# Patient Record
Sex: Male | Born: 1945 | Race: White | Hispanic: No | Marital: Married | Smoking: Former smoker
Health system: Southern US, Community
[De-identification: ages and names within clinical notes are randomized; demographics above are authoritative.]

## PROBLEM LIST (undated history)

## (undated) DIAGNOSIS — F1011 Alcohol abuse, in remission: Secondary | ICD-10-CM

## (undated) DIAGNOSIS — M549 Dorsalgia, unspecified: Secondary | ICD-10-CM

## (undated) DIAGNOSIS — M109 Gout, unspecified: Secondary | ICD-10-CM

## (undated) DIAGNOSIS — E119 Type 2 diabetes mellitus without complications: Secondary | ICD-10-CM

## (undated) DIAGNOSIS — G8929 Other chronic pain: Secondary | ICD-10-CM

## (undated) DIAGNOSIS — E785 Hyperlipidemia, unspecified: Secondary | ICD-10-CM

## (undated) DIAGNOSIS — M1990 Unspecified osteoarthritis, unspecified site: Secondary | ICD-10-CM

## (undated) DIAGNOSIS — I10 Essential (primary) hypertension: Secondary | ICD-10-CM

## (undated) DIAGNOSIS — E781 Pure hyperglyceridemia: Secondary | ICD-10-CM

## (undated) HISTORY — DX: Unspecified osteoarthritis, unspecified site: M19.90

## (undated) HISTORY — DX: Pure hyperglyceridemia: E78.1

---

## 2002-06-30 ENCOUNTER — Ambulatory Visit (HOSPITAL_COMMUNITY): Admission: RE | Admit: 2002-06-30 | Discharge: 2002-06-30 | Payer: Self-pay | Admitting: Gastroenterology

## 2005-05-28 ENCOUNTER — Emergency Department (HOSPITAL_COMMUNITY): Admission: EM | Admit: 2005-05-28 | Discharge: 2005-05-28 | Payer: Self-pay | Admitting: Emergency Medicine

## 2008-01-05 ENCOUNTER — Emergency Department (HOSPITAL_COMMUNITY): Admission: EM | Admit: 2008-01-05 | Discharge: 2008-01-05 | Payer: Self-pay | Admitting: Emergency Medicine

## 2010-06-20 ENCOUNTER — Ambulatory Visit: Payer: Self-pay | Admitting: Gastroenterology

## 2010-06-22 LAB — PATHOLOGY REPORT

## 2014-12-01 LAB — HM DIABETES EYE EXAM

## 2014-12-16 ENCOUNTER — Observation Stay: Payer: Self-pay | Admitting: Internal Medicine

## 2015-01-06 ENCOUNTER — Other Ambulatory Visit: Payer: Self-pay | Admitting: Orthopaedic Surgery

## 2015-01-06 DIAGNOSIS — M545 Low back pain: Secondary | ICD-10-CM

## 2015-01-28 ENCOUNTER — Other Ambulatory Visit: Payer: Self-pay | Admitting: Orthopaedic Surgery

## 2015-01-28 ENCOUNTER — Ambulatory Visit
Admission: RE | Admit: 2015-01-28 | Discharge: 2015-01-28 | Disposition: A | Discharge status: Home / Self Care | Payer: Medicare HMO | Source: Ambulatory Visit | Attending: Orthopaedic Surgery | Admitting: Orthopaedic Surgery

## 2015-01-28 DIAGNOSIS — M545 Low back pain: Secondary | ICD-10-CM

## 2015-01-28 DIAGNOSIS — Z77018 Contact with and (suspected) exposure to other hazardous metals: Secondary | ICD-10-CM

## 2015-02-21 NOTE — H&P (Signed)
PATIENT NAME:  [name], [name] DATE OF BIRTH:  1945/12/16  DATE OF ADMISSION:  12/16/2014  REFERRING PHYSICIAN: Enedina Finnerandolph N. Manson PasseyBrown, MD  PRIMARY CARE PHYSICIAN: Marcine Matarharles W. Phillips Jr., MD  ADMISSION DIAGNOSIS: Hypoglycemia.   HISTORY OF PRESENT ILLNESS: This is a 69 year old Caucasian male who presents to the Emergency Department complaining of feeling bad. The patient states that he began to feel weak and generally unwell this afternoon. His sister called him and asked if there was anything that she could do. He requested a hamburger and ate that without difficulty. He denies feeling any nausea or vomiting, but still felt bad after eating, which prompted him to come to the Emergency Department where he was found to have a low blood sugar. The patient had not been checking his blood sugar as he has been directed at home. He knows he has diabetes and takes his glimepiride after breakfast daily. He has not had any issues with a low blood sugar that he knows about; however, he does mention that he has not felt 100% for a few days now. Notably, he also states that his back pain was worse today than it has ever been "in his entire life." The patient also notes that he has had a dry skin rash on his face for a few weeks, but denies any other symptoms. In the Emergency Department, the patient had 1 ampule of D50 as well as 16 ounces of orange juice as well as peanut butter and crackers. His blood sugar improved from a 39 to 222 after the above interventions; however, it dropped to 119 in one hour, which prompted the Emergency Department to call for admission.   REVIEW OF SYSTEMS:  CONSTITUTIONAL: The patient denies fever, but admits to weakness.  EYES: Denies blurred vision or inflammation.  EARS, NOSE AND THROAT: Denies tinnitus or sore throat.  RESPIRATORY: Denies cough or shortness of breath.  CARDIOVASCULAR: Denies chest pain, palpitations, orthopnea, paroxysmal nocturnal dyspnea.   GASTROINTESTINAL: Denies nausea, vomiting, diarrhea, or abdominal pain.  GENITOURINARY: Denies dysuria, increased frequency or hesitancy of urination.  ENDOCRINE: Denies polyuria or polydipsia.  HEMATOLOGIC AND LYMPHATIC: Denies easy bruising or bleeding.  INTEGUMENTARY: Admits to rash, but denies lesions.  MUSCULOSKELETAL: Admits to back pain, but denies myalgias.  NEUROLOGIC: Denies numbness in his extremities or dysarthria.  PSYCHIATRIC: Denies suicidal ideation or depression.   PAST MEDICAL HISTORY: Diabetes type 2, hypertension, hyperlipidemia, and chronic back pain.   PAST SURGICAL HISTORY: The patient has had no surgeries.   FAMILY HISTORY: His mother had diabetes type 2.   SOCIAL HISTORY: The patient continues to chew tobacco. He says he drinks about 2 beers per day. He denies any drug use. He currently lives by himself, but his wife does not currently lives in his household, as she is recuperating from surgery. 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   MEDICATIONS:  1.  Allopurinol 300 mg 1 tablet p.o. daily.  2.  Amlodipine 10 mg 1 tablet p.o. daily.  3.  Celecoxib 200 mg 1 capsule p.o. daily.  4.  Fenofibrate 67 mg 1 capsule p.o. daily.  5.  Glimepiride 2 mg 1 tablet p.o. daily.  6.  Quinapril 40 mg 1 tablet p.o. daily.  7.  Tramadol 50 mg 1 tablet p.o. every 4-6 hours as needed for pain.   ALLERGIES: ALEVE.   PERTINENT LABORATORY RESULTS AND RADIOGRAPHIC FINDINGS: Serum glucose initially 39, BUN 11, creatinine 0.76, serum sodium 131, potassium  is 3.5, chloride 99, bicarbonate 20, calcium is 9, serum albumin is 3.7, alkaline phosphatase 90, AST 38, ALT 13. White blood cell count is 9, platelet count is 255,000, hemoglobin is 14.7, hematocrit is 44.5, MCV is 100. Urinalysis is negative for infection. Chest x-ray shows no active cardiopulmonary disease.   PHYSICAL EXAMINATION:  VITAL SIGNS: Temperature is 98.2, pulse 85, respirations 20, blood pressure 136/71,  pulse oximetry is 97% on room air.  GENERAL: The patient is alert and oriented x 3 in no apparent distress.  HEENT: Normocephalic, atraumatic. Pupils equal, round, and reactive to light and accommodation. Extraocular movements are intact. Mucous membranes are moist.  NECK: Trachea is midline. No adenopathy. Thyroid is nonpalpable and nontender.  CHEST: Symmetric and atraumatic.  CARDIOVASCULAR: Regular rate and rhythm. Normal S1, S2. No rubs, clicks, or murmurs appreciated.  LUNGS: Clear to auscultation bilaterally. Normal effort and excursion.  ABDOMEN: Positive bowel sounds. Soft, nontender, nondistended. No hepatosplenomegaly.  GENITOURINARY: Deferred.  MUSCULOSKELETAL: The patient moves all 4 extremities equally. He has full range of motion in all 4 extremities. I have not tested his gait.  SKIN: No lesions, but the patient does have a dry erythematous flaking rash on his face as well as some areas of lichenification on his fingers.  EXTREMITIES: No clubbing, cyanosis, or edema.  NEUROLOGIC: Cranial nerves II-XII are grossly intact.  PSYCHIATRIC: Mood is normal. Affect is congruent. The patient has good judgment and insight into his medical condition.   ASSESSMENT AND PLAN: This is a 69 year old male admitted for hypoglycemia.  1.  Hypoglycemia is currently improving: We will continue to monitor the patient. I have given him intravenous fluid with dextrose and we will allow him to eat a regular diet despite having diabetes. We will hold all oral hypoglycemics and not initiate any sliding scale insulin until his sugar has normalized.  2.  Diabetes type 2: Holding medications for now. I will check an A1c, which the patient states has been under control essentially ever since he was diagnosed with diabetes.  3.  Hypertension: We will continue amlodipine and quinapril.  4.  Chronic back pain: Continue Celebrex. I have discontinued tramadol, as this interacts with so many medicines.  5.  Facial  rash: It appears to be a hypersensitivity type rash. I have held tramadol as well as allopurinol due to their interaction with other medications as well as their induction of CYP enzymes in the liver.  6.  Deep vein thrombosis prophylaxis: Heparin.  7.  Gastrointestinal prophylaxis: None.   CODE STATUS: The patient is a full code.   TIME SPENT ON ADMISSION ORDERS AND PATIENT CARE: Approximately 40 minutes.    ____________________________ Kelton Pillar. Sheryle Hail, MD msd:bm D: 12/16/2014 04:03:07 ET T: 12/16/2014 04:52:32 ET JOB#: [id number]  cc: Kelton Pillar. Sheryle Hail, MD, <Dictator> Kelton Pillar DIAMOND MD ELECTRONICALLY SIGNED 12/17/2014 0:45

## 2015-02-21 NOTE — Discharge Summary (Signed)
PATIENT NAME:  [name], Joshua E MR#:  [id number] DATE OF BIRTH:  [birth year]  DATE OF ADMISSION:  12/16/2014 DATE OF DISCHARGE:  12/16/2014  DISCHARGE DIAGNOSES:  1.  Hypoglycemia secondary to diabetic medication. 2.  Type 2 diabetes mellitus. 3.  Hypertension.   DISCHARGE MEDICATIONS:  1.  Fenofibrate 67 mg p.o. daily. 2.  Allopurinol 300 mg p.o. daily. 3.  Tramadol 50 mg every 4-6 hours as needed for pain. 4.  Celebrex 200 mg p.o. daily. 5.  Quinapril 14 mg p.o. daily. 6.  Amlodipine 10 mg p.o. daily. 7.  Amaryl 1 mg p.o. daily. 8.  Lortab has been decreased from 4 mg to 1 mg daily.  DIET:  Carbohydrate controlled diet. He was advised to take Amaryl tomorrow, 12/17/2014.    CONSULTATIONS: None.   HOSPITAL COURSE: The patient is a 69 year old male patient with type 2 diabetes mellitus.  He takes Amaryl 10 mg. He comes in because of not feeling well and also hypoglycemia. The patient had no recent illnesses. No recent fever or sickness. No change in diabetic medication recently. He did not have any diarrhea. He did not have any loss of weight recently. The patient was feeling weak and he called his sister and he said he wanted to eat a burger and he ate a burger but he continued to feel weak and so he still called EMS. By the time EMS arrived, sugars were 39 and so the patient received  also D50 and sugars went up to 222 and then dropped again to 119, so because of the development of hypoglycemia he was admitted to observation status. The patient lives alone. He has some back pain issues for a long time but he says he did not have any recent illnesses or start new medications. He also says that he ate breakfast yesterday and took the medications.  He is admitted for hypoglycemia, started on D5   NS  at  150 mL/h. The patient initially received D5 normal saline but changed to D10 at 100 because of persistent hypoglycemia. We were checking insulin initially every 1 hour and after that every 2  hours. The patient's hyperglycemia resolved.  Sugars have been normal, more than 100 persistently 2 times. The patient had a good lunch and he did not have any further hypoglycemia. He said he wants to go home and he feels well and he denies any complaints. The patient's chest x-ray did not show any pneumonia and urine is clear without evidence of infection. CBC and albumin are within normal limits. The patient's hypoglycemia resolved and he went home in stable condition. I advised him to decrease the dose of Amaryl to 1 mg and see his doctor, Dr. Loma Senderharles Phillips, in about a week. He also has a glucometer at home. I advised him to check blood sugars in the morning and evening as well.    High blood pressure is fairly stable and at the time of discharge was 147/87, heart rate 87.  Regarding blood pressure he is on amlodipine and also quinapril.  History of gout. He is on allopurinol and Celebrex. We will continue that.   PHYSICAL EXAMINATION AT THE TIME OF DISCHARGE:   HEART: S1 and S2 regular.  LUNGS: Clear to auscultation. No wheeze. No rales.  ABDOMEN: Soft, nontender, and nondistended. Bowel sounds present. No focal neurological deficits.   CONDITION ON DISCHARGE:  The patient went home in stable condition.   TIME SPENT:  30 minutes.   ____________________________ Joshua HammingSnehalatha Merryl Buckels, MD  sk:mc D: 12/16/2014 17:57:53 ET T: 12/17/2014 09:23:11 ET JOB#: [id number]  cc: Joshua Hamming, MD, <Dictator> Joshua Hamming MD ELECTRONICALLY SIGNED 12/28/2014 13:54

## 2016-01-17 ENCOUNTER — Emergency Department
Admission: EM | Admit: 2016-01-17 | Discharge: 2016-01-17 | Disposition: A | Discharge status: Home / Self Care | Payer: Medicare HMO | Attending: Emergency Medicine | Admitting: Emergency Medicine

## 2016-01-17 ENCOUNTER — Encounter: Payer: Self-pay | Admitting: Medical Oncology

## 2016-01-17 DIAGNOSIS — Z888 Allergy status to other drugs, medicaments and biological substances status: Secondary | ICD-10-CM | POA: Diagnosis not present

## 2016-01-17 DIAGNOSIS — I10 Essential (primary) hypertension: Secondary | ICD-10-CM | POA: Diagnosis not present

## 2016-01-17 DIAGNOSIS — E119 Type 2 diabetes mellitus without complications: Secondary | ICD-10-CM | POA: Diagnosis not present

## 2016-01-17 DIAGNOSIS — R008 Other abnormalities of heart beat: Secondary | ICD-10-CM | POA: Diagnosis not present

## 2016-01-17 DIAGNOSIS — Z87891 Personal history of nicotine dependence: Secondary | ICD-10-CM | POA: Diagnosis not present

## 2016-01-17 DIAGNOSIS — I498 Other specified cardiac arrhythmias: Secondary | ICD-10-CM

## 2016-01-17 DIAGNOSIS — I499 Cardiac arrhythmia, unspecified: Secondary | ICD-10-CM

## 2016-01-17 DIAGNOSIS — R000 Tachycardia, unspecified: Secondary | ICD-10-CM | POA: Diagnosis present

## 2016-01-17 HISTORY — DX: Essential (primary) hypertension: I10

## 2016-01-17 HISTORY — DX: Type 2 diabetes mellitus without complications: E11.9

## 2016-01-17 LAB — BASIC METABOLIC PANEL
ANION GAP: 12 (ref 5–15)
BUN: 10 mg/dL (ref 6–20)
CHLORIDE: 104 mmol/L (ref 101–111)
CO2: 21 mmol/L — ABNORMAL LOW (ref 22–32)
Calcium: 8.9 mg/dL (ref 8.9–10.3)
Creatinine, Ser: 0.63 mg/dL (ref 0.61–1.24)
GFR calc Af Amer: >60 mL/min (ref >60)
Glucose, Bld: 78 mg/dL (ref 65–99)
POTASSIUM: 4 mmol/L (ref 3.5–5.1)
SODIUM: 137 mmol/L (ref 135–145)

## 2016-01-17 LAB — CBC
HEMATOCRIT: 47.5 % (ref 40.0–52.0)
HEMOGLOBIN: 16 g/dL (ref 13.0–18.0)
MCH: 32.3 pg (ref 26.0–34.0)
MCHC: 33.6 g/dL (ref 32.0–36.0)
MCV: 95.9 fL (ref 80.0–100.0)
Platelets: 195 10*3/uL (ref 150–440)
RBC: 4.95 MIL/uL (ref 4.40–5.90)
RDW: 14 % (ref 11.5–14.5)
WBC: 6.8 10*3/uL (ref 3.8–10.6)

## 2016-01-17 LAB — TROPONIN I: Troponin I: <0.03 ng/mL (ref <0.031)

## 2016-01-17 MED ORDER — METOPROLOL SUCCINATE ER 25 MG PO TB24: 25.0000 mg | ORAL_TABLET | Freq: Every day | ORAL | Status: AC

## 2016-01-17 MED ORDER — AMLODIPINE BESYLATE 10 MG PO TABS: 10.0000 mg | ORAL_TABLET | Freq: Every day | ORAL | Status: DC

## 2016-01-17 MED ORDER — METOPROLOL SUCCINATE ER 50 MG PO TB24
25.0000 mg | ORAL_TABLET | Freq: Every day | ORAL | Status: DC
  Administered 2016-01-17: 25 mg via ORAL
  Filled 2016-01-17: qty 2

## 2016-01-17 MED ORDER — AMLODIPINE BESYLATE 5 MG PO TABS
5.0000 mg | ORAL_TABLET | Freq: Once | ORAL | Status: AC
  Administered 2016-01-17: 5 mg via ORAL
  Filled 2016-01-17: qty 1

## 2016-01-17 NOTE — ED Notes (Signed)
Pt to ED with high HR in low 100's -110's that was noted at Urgent Care.  Pt was at Urgent Care to get refill in blood pressure medication, amlodipine.  On cardiac monitor, patient is noted to have Sinus Tachycardia with frequent monomorphic PVC's and several 3 beat runs of Vtach noted.  Pt asymptomatic with runs.

## 2016-01-17 NOTE — ED Provider Notes (Signed)
[facility] Emergency Department Provider Note     Time seen: ----------------------------------------- 11:28 AM on 01/17/2016 -----------------------------------------    I have reviewed the triage vital signs and the nursing notes.   HISTORY  Chief Complaint Tachycardia    HPI [name] is a 70 y.o. male [facility]. They checked his heart rate and noted to be 113 he was told to come to ER for evaluation. Patient reports his only been out of his blood pressure medicines for 1 day. He denies fevers, chills, chest pain, shortness of breath, other complaints. Patient states chronic back problems which prohibited him from exercising. Patient has not noted any arrhythmias.   Past Medical History  Diagnosis Date  . Diabetes mellitus without complication (HCC)   . Hypertension     There are no active problems to display for this patient.   History reviewed. No pertinent past surgical history.  Allergies Aleve  Social History Social History  Substance Use Topics  . Smoking status: Former Games developer  . Smokeless tobacco: None  . Alcohol Use: None    Review of Systems Constitutional: Negative for fever. Eyes: Negative for visual changes. ENT: Negative for sore throat. Cardiovascular: Negative for chest pain. Respiratory: Negative for shortness of breath. Gastrointestinal: Negative for abdominal pain, vomiting and diarrhea. Genitourinary: Negative for dysuria. Musculoskeletal: Negative for back pain. Skin: Negative for rash. Neurological: Negative for headaches, focal weakness or numbness.  10-point ROS otherwise negative.  ____________________________________________   PHYSICAL EXAM:  VITAL SIGNS: ED Triage Vitals  Enc Vitals Group     BP 01/17/16 0953 142/79 mmHg     Pulse Rate 01/17/16 0953 92     Resp 01/17/16 0953 18     Temp 01/17/16 0953  97.5 F (36.4 C)     Temp Source 01/17/16 0953 Oral     SpO2 01/17/16 0953 99 %     Weight 01/17/16 0953 150 lb (68.04 kg)     Height 01/17/16 0953  (1.702 m)     Head Cir --      Peak Flow --      Pain Score 01/17/16 0954 3     Pain Loc --      Pain Edu? --      Excl. in GC? --     Constitutional: Alert and oriented. Disheveled, in no distress Eyes: Conjunctivae are normal. PERRL. Normal extraocular movements. ENT   Head: Normocephalic and atraumatic.   Nose: No congestion/rhinnorhea.   Mouth/Throat: Mucous membranes are moist.   Neck: No stridor. Cardiovascular: Normal rate, irregular rhythm. Normal and symmetric distal pulses are present in all extremities. No murmurs, rubs, or gallops. Respiratory: Normal respiratory effort without tachypnea nor retractions. Breath sounds are clear and equal bilaterally. No wheezes/rales/rhonchi. Gastrointestinal: Soft and nontender. No distention. No abdominal bruits.  Musculoskeletal: Nontender with normal range of motion in all extremities. No joint effusions.  No lower extremity tenderness nor edema. Neurologic:  Normal speech and language. No gross focal neurologic deficits are appreciated.  Skin:  Skin is warm, dry and intact. No rash noted. Psychiatric: Mood and affect are normal. Speech and behavior are normal. Patient exhibits appropriate insight and judgment. ____________________________________________  EKG: Interpreted by me. Normal sinus rhythm with frequent PVCs in a pattern of bigeminy. Normal PR interval, normal QRS, normal QT interval. Normal axis.  ____________________________________________  ED COURSE:  Pertinent labs & imaging results that were  available during my care of the patient were reviewed by me and considered in my medical decision making (see chart for details).  patient presents and bigeminy with couplets of PVCs. I will discuss with cardiology and check basic labs.   ____________________________________________    LABS (pertinent positives/negatives)  Labs Reviewed  BASIC METABOLIC PANEL - Abnormal; Notable for the following:    CO2 21 (*)    All other components within normal limits  CBC  TROPONIN I   Assessment and plan:  Bigeminy  Plan: Patient presents with labs as dictated above in bigeminy. I discussed case with Dr. Juliann Paresallwood who recommends starting metoprolol in addition to restarting his amlodipine. At this point is asymptomatic, we'll advise close follow-up with Dr. Juliann Paresallwood for outpatient recheck.   Emily FilbertWilliams, Jonathan E, MD   Emily FilbertJonathan E Williams, MD 01/17/16 [phone]

## 2016-01-17 NOTE — ED Notes (Signed)
[facility] reports [facility] med [facility] and they checked his HR and noted it to be 113 and was told to come to er. Pt reports he has only been out of 1 day of his BP med. Pt denies chest pain, denies other sx's but a slight headache.

## 2016-01-17 NOTE — Discharge Instructions (Signed)
Hypertension °Hypertension, commonly called high blood pressure, is when the force of blood pumping through your arteries is too strong. Your arteries are the blood vessels that carry blood from your heart throughout your body. A blood pressure reading consists of a higher number over a lower number, such as 110/72. The higher number (systolic) is the pressure inside your arteries when your heart pumps. The lower number (diastolic) is the pressure inside your arteries when your heart relaxes. Ideally you want your blood pressure below 120/80. °Hypertension forces your heart to work harder to pump blood. Your arteries may become narrow or stiff. Having untreated or uncontrolled hypertension can cause heart attack, stroke, kidney disease, and other problems. °RISK FACTORS °Some risk factors for high blood pressure are controllable. Others are not.  °Risk factors you cannot control include:  °· Race. You may be at higher risk if you are African American. °· Age. Risk increases with age. °· Gender. Men are at higher risk than women before age 45 years. After age 65, women are at higher risk than men. °Risk factors you can control include: °· Not getting enough exercise or physical activity. °· Being overweight. °· Getting too much fat, sugar, calories, or salt in your diet. °· Drinking too much alcohol. °SIGNS AND SYMPTOMS °Hypertension does not usually cause signs or symptoms. Extremely high blood pressure (hypertensive crisis) may cause headache, anxiety, shortness of breath, and nosebleed. °DIAGNOSIS °To check if you have hypertension, your health care provider will measure your blood pressure while you are seated, with your arm held at the level of your heart. It should be measured at least twice using the same arm. Certain conditions can cause a difference in blood pressure between your right and left arms. A blood pressure reading that is higher than normal on one occasion does not mean that you need treatment. If  it is not clear whether you have high blood pressure, you may be asked to return on a different day to have your blood pressure checked again. Or, you may be asked to monitor your blood pressure at home for 1 or more weeks. °TREATMENT °Treating high blood pressure includes making lifestyle changes and possibly taking medicine. Living a healthy lifestyle can help lower high blood pressure. You may need to change some of your habits. °Lifestyle changes may include: °· Following the DASH diet. This diet is high in fruits, vegetables, and whole grains. It is low in salt, red meat, and added sugars. °· Keep your sodium intake below 2,300 mg per day. °· Getting at least 30-45 minutes of aerobic exercise at least 4 times per week. °· Losing weight if necessary. °· Not smoking. °· Limiting alcoholic beverages. °· Learning ways to reduce stress. °Your health care provider may prescribe medicine if lifestyle changes are not enough to get your blood pressure under control, and if one of the following is true: °· You are 18-59 years of age and your systolic blood pressure is above 140. °· You are 60 years of age or older, and your systolic blood pressure is above 150. °· Your diastolic blood pressure is above 90. °· You have diabetes, and your systolic blood pressure is over 140 or your diastolic blood pressure is over 90. °· You have kidney disease and your blood pressure is above 140/90. °· You have heart disease and your blood pressure is above 140/90. °Your personal target blood pressure may vary depending on your medical conditions, your age, and other factors. °HOME CARE INSTRUCTIONS °·   Have your blood pressure rechecked as directed by your health care provider.   °· Take medicines only as directed by your health care provider. Follow the directions carefully. Blood pressure medicines must be taken as prescribed. The medicine does not work as well when you skip doses. Skipping doses also puts you at risk for  problems. °· Do not smoke.   °· Monitor your blood pressure at home as directed by your health care provider.  °SEEK MEDICAL CARE IF:  °· You think you are having a reaction to medicines taken. °· You have recurrent headaches or feel dizzy. °· You have swelling in your ankles. °· You have trouble with your vision. °SEEK IMMEDIATE MEDICAL CARE IF: °· You develop a severe headache or confusion. °· You have unusual weakness, numbness, or feel faint. °· You have severe chest or abdominal pain. °· You vomit repeatedly. °· You have trouble breathing. °MAKE SURE YOU:  °· Understand these instructions. °· Will watch your condition. °· Will get help right away if you are not doing well or get worse. °  °This information is not intended to replace advice given to you by your health care provider. Make sure you discuss any questions you have with your health care provider. °  °Document Released: 10/09/2005 Document Revised: 02/23/2015 Document Reviewed: 08/01/2013 °Elsevier Interactive Patient Education ©2016 Else[employer] Inc[employer] ° °Palpitations °A palpitation is the feeling that your heartbeat is irregular or is faster than normal. It may feel like your heart is fluttering or skipping a beat. Palpitations are usually not a serious problem. However, in some cases, you may need further medical evaluation. °CAUSES  °Palpitations can be caused by: °· Smoking. °· Caffeine or other stimulants, such as diet pills or energy drinks. °· Alcohol. °· Stress and anxiety. °· Strenuous physical activity. °· Fatigue. °· Certain medicines. °· Heart disease, especially if you have a history of irregular heart rhythms (arrhythmias), such as atrial fibrillation, atrial flutter, or supraventricular tachycardia. °· An improperly working pacemaker or defibrillator. °DIAGNOSIS  °To find the cause of your palpitations, your health care provider will take your medical history and perform a physical exam. Your health care provider may also have you take a  test called an ambulatory electrocardiogram (ECG). An ECG records your heartbeat patterns over a 24-hour period. You may also have other tests, such as: °· Transthoracic echocardiogram (TTE). During echocardiography, sound waves are used to evaluate how blood flows through your heart. °· Transesophageal echocardiogram (TEE). °· Cardiac monitoring. This allows your health care provider to monitor your heart rate and rhythm in real time. °· Holter monitor. This is a portable device that records your heartbeat and can help diagnose heart arrhythmias. It allows your health care provider to track your heart activity for several days, if needed. °· Stress tests by exercise or by giving medicine that makes the heart beat faster. °TREATMENT  °Treatment of palpitations depends on the cause of your symptoms and can vary greatly. Most cases of palpitations do not require any treatment other than time, relaxation, and monitoring your symptoms. Other causes, such as atrial fibrillation, atrial flutter, or supraventricular tachycardia, usually require further treatment. °HOME CARE INSTRUCTIONS  °· Avoid: °¨ Caffeinated coffee, tea, soft drinks, diet pills, and energy drinks. °¨ Chocolate. °¨ Alcohol. °· Stop smoking if you smoke. °· Reduce your stress and anxiety. Things that can help you relax include: °¨ A method of controlling things in your body, such as your heartbeats, with your mind (biofeedback). °¨ Yoga. °¨ Meditation. °¨   Physical activity such as swimming, jogging, or walking. °· Get plenty of rest and sleep. °SEEK MEDICAL CARE IF:  °· You continue to have a fast or irregular heartbeat beyond 24 hours. °· Your palpitations occur more often. °SEEK IMMEDIATE MEDICAL CARE IF: °· You have chest pain or shortness of breath. °· You have a severe headache. °· You feel dizzy or you faint. °MAKE SURE YOU: °· Understand these instructions. °· Will watch your condition. °· Will get help right away if you are not doing well or get  worse. °  °This information is not intended to replace advice given to you by your health care provider. Make sure you discuss any questions you have with your health care provider. °  °Document Released: 10/06/2000 Document Revised: 10/14/2013 Document Reviewed: 12/08/2011 °Elsevier Interactive Patient Education ©2016 Elsevier Inc. ° °

## 2016-01-24 ENCOUNTER — Encounter: Payer: Self-pay | Admitting: Internal Medicine

## 2016-01-25 ENCOUNTER — Encounter: Payer: Self-pay | Admitting: Internal Medicine

## 2016-01-25 ENCOUNTER — Ambulatory Visit (INDEPENDENT_AMBULATORY_CARE_PROVIDER_SITE_OTHER): Payer: Medicare HMO | Admitting: Internal Medicine

## 2016-01-25 VITALS — BP 126/72 | HR 73 | Temp 98.2°F | Ht 67.0 in | Wt 148.5 lb

## 2016-01-25 DIAGNOSIS — M1A0790 Idiopathic chronic gout, unspecified ankle and foot, without tophus (tophi): Secondary | ICD-10-CM | POA: Diagnosis not present

## 2016-01-25 DIAGNOSIS — E785 Hyperlipidemia, unspecified: Secondary | ICD-10-CM | POA: Diagnosis not present

## 2016-01-25 DIAGNOSIS — G8929 Other chronic pain: Secondary | ICD-10-CM

## 2016-01-25 DIAGNOSIS — M109 Gout, unspecified: Secondary | ICD-10-CM | POA: Insufficient documentation

## 2016-01-25 DIAGNOSIS — I10 Essential (primary) hypertension: Secondary | ICD-10-CM | POA: Diagnosis not present

## 2016-01-25 DIAGNOSIS — M549 Dorsalgia, unspecified: Secondary | ICD-10-CM

## 2016-01-25 DIAGNOSIS — E119 Type 2 diabetes mellitus without complications: Secondary | ICD-10-CM | POA: Diagnosis not present

## 2016-01-25 LAB — COMPREHENSIVE METABOLIC PANEL
ALBUMIN: 4.2 g/dL (ref 3.5–5.2)
ALT: 13 U/L (ref 0–53)
AST: 32 U/L (ref 0–37)
Alkaline Phosphatase: 61 U/L (ref 39–117)
BUN: 12 mg/dL (ref 6–23)
CHLORIDE: 99 meq/L (ref 96–112)
CO2: 27 mEq/L (ref 19–32)
Calcium: 9.7 mg/dL (ref 8.4–10.5)
Creatinine, Ser: 0.61 mg/dL (ref 0.40–1.50)
GFR: 139.21 mL/min (ref >60.00)
Glucose, Bld: 71 mg/dL (ref 70–99)
POTASSIUM: 4 meq/L (ref 3.5–5.1)
SODIUM: 136 meq/L (ref 135–145)
Total Bilirubin: 0.7 mg/dL (ref 0.2–1.2)
Total Protein: 7.6 g/dL (ref 6.0–8.3)

## 2016-01-25 LAB — LIPID PANEL
CHOLESTEROL: 155 mg/dL (ref 0–200)
HDL: 99.3 mg/dL (ref >39.00)
LDL CALC: 44 mg/dL (ref 0–99)
NonHDL: 55.52
Total CHOL/HDL Ratio: 2
Triglycerides: 56 mg/dL (ref 0.0–149.0)
VLDL: 11.2 mg/dL (ref 0.0–40.0)

## 2016-01-25 LAB — HEMOGLOBIN A1C: HEMOGLOBIN A1C: 5 % (ref 4.6–6.5)

## 2016-01-25 NOTE — Assessment & Plan Note (Signed)
Controlled on Toprol and Accupril CBC and CMET today

## 2016-01-25 NOTE — Patient Instructions (Signed)

## 2016-01-25 NOTE — Progress Notes (Signed)
Pre visit review using our clinic review tool, if applicable. No additional management support is needed unless otherwise documented below in the visit note. 

## 2016-01-25 NOTE — Assessment & Plan Note (Signed)
Encouraged him to consume a low fat diet Continue Fenofibrate

## 2016-01-25 NOTE — Assessment & Plan Note (Signed)
Will check A1C today Encouraged him to consume a low fat, low carb diet Foot exam today Encouraged yearly eye exams Flu shot UTD He declines pneumonia vaccines Continue Amaryl unless instructed otherwise

## 2016-01-25 NOTE — Assessment & Plan Note (Signed)
Advised him to continue Celebrex He has already been referred to pain management by cardiology

## 2016-01-25 NOTE — Assessment & Plan Note (Signed)
Continue Allopurinol Will check uric acid level today

## 2016-01-25 NOTE — Progress Notes (Signed)
HPI  Pt presents to the clinic today to establish care and for management of the conditions listed below. He is transferring care from Dr. Vear Clock.  DM 2: He checks his sugars daily but can not recall what they are running. He takes Amaryl daily as prescribed. He has had 1 hypoglycemic episode of 34, for which he had to go to the ER. His last eye exam was 1 year ago. His flu shot was 07/2015. He does not take pneumonia vaccines.  HTN: BP controlled on Accupril and Toprol. His BP today is 126/72. He reports he was not feeling well the other day, checked his BP at home with a wrist cuff and it was 195/155. He was dizzy but denied blurred vision, chest pain or shortness of breath. He was having sensation that his heart was racing prior to being put on Metoprolol. He recently established care with Dr. Fransisco Beau at [facility].  HLD: He has not had his cholesterol checked in the last 6 months. He is taking Fenofibrate as prescribed. He does not try to consume a low fat diet.  Gout: He has not had any flares since he started on Allopurinol.  Chronic Back Pain: He takes Celebrex daily, but does not feel like it helps. He is awaiting pain clinic referral from his cardiologist.  Past Medical History  Diagnosis Date  . Diabetes mellitus without complication (HCC)   . Hypertension   . Hypertriglyceridemia   . Arthritis     Current Outpatient Prescriptions  Medication Sig Dispense Refill  . allopurinol (ZYLOPRIM) 300 MG tablet Take 300 mg by mouth daily.    . celecoxib (CELEBREX) 200 MG capsule Take 200 mg by mouth daily.    . fenofibrate micronized (LOFIBRA) 67 MG capsule TAKE ONE CAPSULE BY MOUTH EVERY DAY WITH SUPPER  1  . glimepiride (AMARYL) 1 MG tablet Take 1 mg by mouth daily with breakfast.     . metoprolol succinate (TOPROL XL) 25 MG 24 hr tablet Take 1 tablet (25 mg total) by mouth daily. 30 tablet 1  . quinapril (ACCUPRIL) 20 MG tablet Take 20 mg by mouth daily.      No current  facility-administered medications for this visit.    Allergies  Allergen Reactions  . Aleve [Naproxen]     Family History  Problem Relation Age of Onset  . Colon cancer Mother   . Hyperlipidemia Mother   . Hypertension Mother   . Arthritis Mother   . Arthritis Father   . Heart disease Father   . Heart disease Paternal Grandfather     Social History   Social History  . Marital Status: Married    Spouse Name: N/A  . Number of Children: N/A  . Years of Education: N/A   Occupational History  . Not on file.   Social History Main Topics  . Smoking status: Former Games developer  . Smokeless tobacco: Not on file  . Alcohol Use: 0.0 oz/week    0 Standard drinks or equivalent per week     Comment: beer--2 daily  . Drug Use: No  . Sexual Activity: Yes   Other Topics Concern  . Not on file   Social History Narrative    ROS:  Constitutional: Denies fever, malaise, fatigue, headache or abrupt weight changes.  HEENT: Denies eye pain, eye redness, ear pain, ringing in the ears, wax buildup, runny nose, nasal congestion, bloody nose, or sore throat. Respiratory: Denies difficulty breathing, shortness of breath, cough or sputum production.  Cardiovascular: Denies chest pain, chest tightness, palpitations or swelling in the hands or feet.  Musculoskeletal: Pt reports chronic back pain. Denies difficulty with gait, muscle pain or joint swelling.  Skin: Denies redness, rashes, lesions or ulcercations.  Neurological: Denies dizziness, difficulty with memory, difficulty with speech or problems with balance and coordination.  Psych: Pt's sister reports patient is feeling anxious (he denies this). Denies anxiety, depression, SI/HI.  No other specific complaints in a complete review of systems (except as listed in HPI above).  PE:  BP 126/72 mmHg  Pulse 73  Temp(Src) 98.2 F (36.8 C) (Oral)  Ht 5\' 7"  (1.702 m)  Wt 148 lb 8 oz (67.359 kg)  BMI 23.25 kg/m2  SpO2 72% Wt Readings from  Last 3 Encounters:  01/25/16 148 lb 8 oz (67.359 kg)  01/17/16 150 lb (68.04 kg)    General: Appears his stated age, chronically ill appearing, in NAD. Cardiovascular: Normal rate and rhythm. S1,S2 noted.  No murmur, rubs or gallops noted. No JVD or BLE edema. No carotid bruits noted. Pulmonary/Chest: Normal effort and positive vesicular breath sounds. No respiratory distress. No wheezes, rales or ronchi noted.  Musculoskeletal: Obvious curvature of spine. No bony tenderness noted over the spine. He walks humped over, but seems fairly steady.  Neurological: Alert and oriented.  Psychiatric: Mood and affect normal. Behavior is normal. Judgment and thought content normal.     BMET    Component Value Date/Time   NA 137 01/17/2016 0959   K 4.0 01/17/2016 0959   CL 104 01/17/2016 0959   CO2 21* 01/17/2016 0959   GLUCOSE 78 01/17/2016 0959   BUN 10 01/17/2016 0959   CREATININE 0.63 01/17/2016 0959   CALCIUM 8.9 01/17/2016 0959   GFRNONAA >60 01/17/2016 0959   GFRAA >60 01/17/2016 0959    Lipid Panel  No results found for: CHOL, TRIG, HDL, CHOLHDL, VLDL, LDLCALC  CBC    Component Value Date/Time   WBC 6.8 01/17/2016 0959   RBC 4.95 01/17/2016 0959   HGB 16.0 01/17/2016 0959   HCT 47.5 01/17/2016 0959   PLT 195 01/17/2016 0959   MCV 95.9 01/17/2016 0959   MCH 32.3 01/17/2016 0959   MCHC 33.6 01/17/2016 0959   RDW 14.0 01/17/2016 0959    Hgb A1C No results found for: HGBA1C   Assessment and Plan:

## 2016-01-27 ENCOUNTER — Encounter: Payer: Self-pay | Admitting: Internal Medicine

## 2016-01-27 NOTE — Addendum Note (Signed)
Addended by: Roena MaladyEVONTENNO, Suhaas Agena Y on: 01/27/2016 02:04 PM   Modules accepted: Orders, Medications

## 2016-01-28 MED ORDER — ALLOPURINOL 300 MG PO TABS: 300.0000 mg | ORAL_TABLET | Freq: Every day | ORAL | Status: DC

## 2016-02-02 ENCOUNTER — Encounter: Payer: Self-pay | Admitting: Certified Registered"

## 2016-02-02 ENCOUNTER — Ambulatory Visit
Admission: RE | Admit: 2016-02-02 | Discharge: 2016-02-02 | Disposition: A | Discharge status: Home / Self Care | Payer: Medicare HMO | Source: Ambulatory Visit | Attending: Internal Medicine | Admitting: Internal Medicine

## 2016-02-02 ENCOUNTER — Encounter: Payer: Self-pay | Admitting: *Deleted

## 2016-02-02 ENCOUNTER — Encounter
Admission: RE | Disposition: A | Discharge status: Home / Self Care | Payer: Self-pay | Source: Ambulatory Visit | Attending: Internal Medicine

## 2016-02-02 DIAGNOSIS — Z87891 Personal history of nicotine dependence: Secondary | ICD-10-CM | POA: Insufficient documentation

## 2016-02-02 DIAGNOSIS — E119 Type 2 diabetes mellitus without complications: Secondary | ICD-10-CM | POA: Insufficient documentation

## 2016-02-02 DIAGNOSIS — R000 Tachycardia, unspecified: Secondary | ICD-10-CM | POA: Insufficient documentation

## 2016-02-02 DIAGNOSIS — I959 Hypotension, unspecified: Secondary | ICD-10-CM | POA: Diagnosis not present

## 2016-02-02 DIAGNOSIS — I499 Cardiac arrhythmia, unspecified: Secondary | ICD-10-CM | POA: Diagnosis not present

## 2016-02-02 DIAGNOSIS — M1990 Unspecified osteoarthritis, unspecified site: Secondary | ICD-10-CM | POA: Diagnosis not present

## 2016-02-02 DIAGNOSIS — R0602 Shortness of breath: Secondary | ICD-10-CM | POA: Insufficient documentation

## 2016-02-02 DIAGNOSIS — I10 Essential (primary) hypertension: Secondary | ICD-10-CM | POA: Diagnosis not present

## 2016-02-02 DIAGNOSIS — Z79899 Other long term (current) drug therapy: Secondary | ICD-10-CM | POA: Insufficient documentation

## 2016-02-02 DIAGNOSIS — Z886 Allergy status to analgesic agent status: Secondary | ICD-10-CM | POA: Insufficient documentation

## 2016-02-02 DIAGNOSIS — R079 Chest pain, unspecified: Secondary | ICD-10-CM | POA: Diagnosis present

## 2016-02-02 DIAGNOSIS — I200 Unstable angina: Secondary | ICD-10-CM | POA: Diagnosis not present

## 2016-02-02 DIAGNOSIS — I340 Nonrheumatic mitral (valve) insufficiency: Secondary | ICD-10-CM | POA: Insufficient documentation

## 2016-02-02 DIAGNOSIS — M545 Low back pain: Secondary | ICD-10-CM | POA: Diagnosis not present

## 2016-02-02 HISTORY — PX: CARDIAC CATHETERIZATION: SHX172

## 2016-02-02 LAB — CBC
HCT: 44 % (ref 40.0–52.0)
Hemoglobin: 15.1 g/dL (ref 13.0–18.0)
MCH: 32.8 pg (ref 26.0–34.0)
MCHC: 34.2 g/dL (ref 32.0–36.0)
MCV: 95.8 fL (ref 80.0–100.0)
PLATELETS: 200 10*3/uL (ref 150–440)
RBC: 4.59 MIL/uL (ref 4.40–5.90)
RDW: 14 % (ref 11.5–14.5)
WBC: 8.9 10*3/uL (ref 3.8–10.6)

## 2016-02-02 LAB — BASIC METABOLIC PANEL
ANION GAP: 14 (ref 5–15)
BUN: 7 mg/dL (ref 6–20)
CALCIUM: 9.1 mg/dL (ref 8.9–10.3)
CHLORIDE: 102 mmol/L (ref 101–111)
CO2: 18 mmol/L — AB (ref 22–32)
CREATININE: 0.67 mg/dL (ref 0.61–1.24)
GFR calc Af Amer: >60 mL/min (ref >60)
GFR calc non Af Amer: >60 mL/min (ref >60)
GLUCOSE: 80 mg/dL (ref 65–99)
Potassium: 3.9 mmol/L (ref 3.5–5.1)
Sodium: 134 mmol/L — ABNORMAL LOW (ref 135–145)

## 2016-02-02 LAB — PROTIME-INR
INR: 1.06
PROTHROMBIN TIME: 14 s (ref 11.4–15.0)

## 2016-02-02 SURGERY — LEFT HEART CATH AND CORONARY ANGIOGRAPHY
Anesthesia: Moderate Sedation

## 2016-02-02 MED ORDER — SODIUM CHLORIDE 0.9 % IV SOLN
INTRAVENOUS | Status: DC
Start: 2016-02-02 — End: 2016-02-02
  Administered 2016-02-02 (×2): via INTRAVENOUS

## 2016-02-02 MED ORDER — MIDAZOLAM HCL 2 MG/2ML IJ SOLN
INTRAMUSCULAR | Status: AC
  Filled 2016-02-02: qty 2

## 2016-02-02 MED ORDER — ONDANSETRON HCL 4 MG/2ML IJ SOLN: 4.0000 mg | Freq: Four times a day (QID) | INTRAMUSCULAR | Status: DC | PRN

## 2016-02-02 MED ORDER — FENTANYL CITRATE (PF) 100 MCG/2ML IJ SOLN
INTRAMUSCULAR | Status: DC | PRN
  Administered 2016-02-02: 25 ug via INTRAVENOUS

## 2016-02-02 MED ORDER — ACETAMINOPHEN 325 MG PO TABS: 650.0000 mg | ORAL_TABLET | ORAL | Status: DC | PRN

## 2016-02-02 MED ORDER — SODIUM CHLORIDE 0.9 % IV SOLN: 250.0000 mL | INTRAVENOUS | Status: DC | PRN

## 2016-02-02 MED ORDER — FENTANYL CITRATE (PF) 100 MCG/2ML IJ SOLN
INTRAMUSCULAR | Status: AC
  Filled 2016-02-02: qty 2

## 2016-02-02 MED ORDER — SODIUM CHLORIDE 0.9% FLUSH: 3.0000 mL | Freq: Two times a day (BID) | INTRAVENOUS | Status: DC

## 2016-02-02 MED ORDER — MIDAZOLAM HCL 2 MG/2ML IJ SOLN
INTRAMUSCULAR | Status: DC | PRN
  Administered 2016-02-02: 1 mg via INTRAVENOUS

## 2016-02-02 MED ORDER — SODIUM CHLORIDE 0.9% FLUSH: 3.0000 mL | INTRAVENOUS | Status: DC | PRN

## 2016-02-02 MED ORDER — SODIUM CHLORIDE 0.9 % WEIGHT BASED INFUSION: 3.0000 mL/kg/h | INTRAVENOUS | Status: DC

## 2016-02-02 MED ORDER — HEPARIN (PORCINE) IN NACL 2-0.9 UNIT/ML-% IJ SOLN
INTRAMUSCULAR | Status: AC
  Filled 2016-02-02: qty 500

## 2016-02-02 SURGICAL SUPPLY — 8 items
CATH INFINITI 5FR ANG PIGTAIL (CATHETERS) ×3 IMPLANT
CATH INFINITI 5FR JL4 (CATHETERS) ×3 IMPLANT
CATH INFINITI JR4 5F (CATHETERS) ×3 IMPLANT
KIT MANI 3VAL PERCEP (MISCELLANEOUS) ×3 IMPLANT
NEEDLE PERC 18GX7CM (NEEDLE) ×3 IMPLANT
PACK CARDIAC CATH (CUSTOM PROCEDURE TRAY) ×3 IMPLANT
SHEATH AVANTI 5FR X 11CM (SHEATH) ×3 IMPLANT
WIRE EMERALD 3MM-J .035X150CM (WIRE) ×3 IMPLANT

## 2016-02-02 NOTE — Discharge Instructions (Signed)
Angiogram, Care After °Refer to this sheet in the next few weeks. These instructions provide you with information about caring for yourself after your procedure. Your health care provider may also give you more specific instructions. Your treatment has been planned according to current medical practices, but problems sometimes occur. Call your health care provider if you have any problems or questions after your procedure. °WHAT TO EXPECT AFTER THE PROCEDURE °After your procedure, it is typical to have the following: °· Bruising at the catheter insertion site that usually fades within 1-2 weeks. °· Blood collecting in the tissue (hematoma) that may be painful to the touch. It should usually decrease in size and tenderness within 1-2 weeks. °HOME CARE INSTRUCTIONS °· Take medicines only as directed by your health care provider. °· You may shower 24-48 hours after the procedure or as directed by your health care provider. Remove the bandage (dressing) and gently wash the site with plain soap and water. Pat the area dry with a clean towel. Do not rub the site, because this may cause bleeding. °· Do not take baths, swim, or use a hot tub until your health care provider approves. °· Check your insertion site every day for redness, swelling, or drainage. °· Do not apply powder or lotion to the site. °· Do not lift over 10 lb (4.5 kg) for 5 days after your procedure or as directed by your health care provider. °· Ask your health care provider when it is okay to: °¨ Return to work or school. °¨ Resume usual physical activities or sports. °¨ Resume sexual activity. °· Do not drive home if you are discharged the same day as the procedure. Have someone else drive you. °· You may drive 24 hours after the procedure unless otherwise instructed by your health care provider. °· Do not operate machinery or power tools for 24 hours after the procedure or as directed by your health care provider. °· If your procedure was done as an  outpatient procedure, which means that you went home the same day as your procedure, a responsible adult should be with you for the first 24 hours after you arrive home. °· Keep all follow-up visits as directed by your health care provider. This is important. °SEEK MEDICAL CARE IF: °· You have a fever. °· You have chills. °· You have increased bleeding from the catheter insertion site. Hold pressure on the site. °SEEK IMMEDIATE MEDICAL CARE IF: °· You have unusual pain at the catheter insertion site. °· You have redness, warmth, or swelling at the catheter insertion site. °· You have drainage (other than a small amount of blood on the dressing) from the catheter insertion site. °· The catheter insertion site is bleeding, and the bleeding does not stop after 30 minutes of holding steady pressure on the site. °· The area near or just beyond the catheter insertion site becomes pale, cool, tingly, or numb. °  °This information is not intended to replace advice given to you by your health care provider. Make sure you discuss any questions you have with your health care provider. °  °Document Released: 04/27/2005 Document Revised: 10/30/2014 Document Reviewed: 03/12/2013 °Elsevier Interactive Patient Education ©2016 [employer] ° °

## 2016-02-03 ENCOUNTER — Encounter: Payer: Self-pay | Admitting: Internal Medicine

## 2016-02-27 ENCOUNTER — Other Ambulatory Visit: Payer: Self-pay | Admitting: Internal Medicine

## 2016-02-29 ENCOUNTER — Encounter: Payer: Self-pay | Admitting: Internal Medicine

## 2016-02-29 MED ORDER — CELECOXIB 200 MG PO CAPS: 200.0000 mg | ORAL_CAPSULE | Freq: Every day | ORAL | Status: DC

## 2016-02-29 MED ORDER — GLUCOSE BLOOD VI STRP: 1.0000 | ORAL_STRIP | Freq: Every day | Status: AC | PRN

## 2016-06-15 ENCOUNTER — Encounter: Payer: Self-pay | Admitting: Internal Medicine

## 2016-06-16 ENCOUNTER — Other Ambulatory Visit: Payer: Self-pay | Admitting: Internal Medicine

## 2016-06-16 ENCOUNTER — Other Ambulatory Visit: Payer: Self-pay | Admitting: Orthopaedic Surgery

## 2016-06-16 DIAGNOSIS — M810 Age-related osteoporosis without current pathological fracture: Secondary | ICD-10-CM

## 2016-06-16 DIAGNOSIS — M545 Low back pain: Secondary | ICD-10-CM

## 2016-06-16 DIAGNOSIS — E559 Vitamin D deficiency, unspecified: Secondary | ICD-10-CM

## 2016-06-16 DIAGNOSIS — R5381 Other malaise: Secondary | ICD-10-CM

## 2016-06-16 NOTE — Telephone Encounter (Signed)
Pt sister, Darden DatesDee Geimer, dropped off the lab orders and I placed in the Rx tower. I made him a lab appt on Tues, 8/29 for these orders.

## 2016-06-20 ENCOUNTER — Other Ambulatory Visit (INDEPENDENT_AMBULATORY_CARE_PROVIDER_SITE_OTHER): Payer: Medicare HMO

## 2016-06-20 DIAGNOSIS — E559 Vitamin D deficiency, unspecified: Secondary | ICD-10-CM

## 2016-06-20 DIAGNOSIS — R5381 Other malaise: Secondary | ICD-10-CM

## 2016-06-20 DIAGNOSIS — M810 Age-related osteoporosis without current pathological fracture: Secondary | ICD-10-CM | POA: Diagnosis not present

## 2016-06-20 LAB — CBC WITH DIFFERENTIAL/PLATELET
Basophils Absolute: 0 10*3/uL (ref 0.0–0.1)
Basophils Relative: 0.4 % (ref 0.0–3.0)
EOS PCT: 1.2 % (ref 0.0–5.0)
Eosinophils Absolute: 0.1 10*3/uL (ref 0.0–0.7)
HEMATOCRIT: 42 % (ref 39.0–52.0)
Hemoglobin: 14.3 g/dL (ref 13.0–17.0)
LYMPHS ABS: 3.2 10*3/uL (ref 0.7–4.0)
LYMPHS PCT: 38.8 % (ref 12.0–46.0)
MCHC: 34 g/dL (ref 30.0–36.0)
MCV: 99.5 fl (ref 78.0–100.0)
MONOS PCT: 10.4 % (ref 3.0–12.0)
Monocytes Absolute: 0.8 10*3/uL (ref 0.1–1.0)
NEUTROS PCT: 49.2 % (ref 43.0–77.0)
Neutro Abs: 4 10*3/uL (ref 1.4–7.7)
Platelets: 231 10*3/uL (ref 150.0–400.0)
RBC: 4.23 Mil/uL (ref 4.22–5.81)
RDW: 14.6 % (ref 11.5–15.5)
WBC: 8.1 10*3/uL (ref 4.0–10.5)

## 2016-06-20 LAB — COMPREHENSIVE METABOLIC PANEL
ALBUMIN: 3.8 g/dL (ref 3.5–5.2)
ALK PHOS: 89 U/L (ref 39–117)
ALT: 6 U/L (ref 0–53)
AST: 20 U/L (ref 0–37)
BILIRUBIN TOTAL: 0.7 mg/dL (ref 0.2–1.2)
BUN: 7 mg/dL (ref 6–23)
CALCIUM: 9.1 mg/dL (ref 8.4–10.5)
CO2: 30 mEq/L (ref 19–32)
CREATININE: 0.62 mg/dL (ref 0.40–1.50)
Chloride: 100 mEq/L (ref 96–112)
GFR: 136.46 mL/min (ref >60.00)
GLUCOSE: 80 mg/dL (ref 70–99)
POTASSIUM: 4.7 meq/L (ref 3.5–5.1)
Sodium: 137 mEq/L (ref 135–145)
TOTAL PROTEIN: 6.9 g/dL (ref 6.0–8.3)

## 2016-06-20 LAB — HCG, QUANTITATIVE, PREGNANCY: Quantitative HCG: 1.48 m[IU]/mL

## 2016-06-20 LAB — VITAMIN D 25 HYDROXY (VIT D DEFICIENCY, FRACTURES): VITD: 45.71 ng/mL (ref 30.00–100.00)

## 2016-06-20 LAB — TSH: TSH: 0.53 u[IU]/mL (ref 0.35–4.50)

## 2016-06-21 LAB — HEPATITIS B SURFACE ANTIBODY, QUANTITATIVE: Hepatitis B-Post: <5 m[IU]/mL

## 2016-06-21 LAB — PARATHYROID HORMONE, INTACT (NO CA): PTH: 10 pg/mL — AB (ref 14–64)

## 2016-06-23 ENCOUNTER — Other Ambulatory Visit: Payer: Self-pay | Admitting: Orthopaedic Surgery

## 2016-06-23 DIAGNOSIS — M810 Age-related osteoporosis without current pathological fracture: Secondary | ICD-10-CM

## 2016-06-28 ENCOUNTER — Ambulatory Visit
Admission: RE | Admit: 2016-06-28 | Discharge: 2016-06-28 | Disposition: A | Discharge status: Home / Self Care | Payer: Medicare HMO | Source: Ambulatory Visit | Attending: Orthopaedic Surgery | Admitting: Orthopaedic Surgery

## 2016-06-28 DIAGNOSIS — M545 Low back pain: Secondary | ICD-10-CM

## 2016-06-28 DIAGNOSIS — M810 Age-related osteoporosis without current pathological fracture: Secondary | ICD-10-CM

## 2016-07-20 ENCOUNTER — Other Ambulatory Visit: Payer: Self-pay | Admitting: Internal Medicine

## 2016-07-25 ENCOUNTER — Ambulatory Visit (INDEPENDENT_AMBULATORY_CARE_PROVIDER_SITE_OTHER): Payer: Medicare HMO | Admitting: Orthopaedic Surgery

## 2016-07-25 DIAGNOSIS — M545 Low back pain: Secondary | ICD-10-CM | POA: Diagnosis not present

## 2016-07-25 DIAGNOSIS — M5416 Radiculopathy, lumbar region: Secondary | ICD-10-CM | POA: Diagnosis not present

## 2016-07-26 ENCOUNTER — Other Ambulatory Visit (INDEPENDENT_AMBULATORY_CARE_PROVIDER_SITE_OTHER): Payer: Self-pay | Admitting: Orthopaedic Surgery

## 2016-07-26 DIAGNOSIS — R531 Weakness: Secondary | ICD-10-CM

## 2016-08-01 ENCOUNTER — Ambulatory Visit: Payer: Medicare HMO

## 2016-08-04 ENCOUNTER — Ambulatory Visit
Admission: RE | Admit: 2016-08-04 | Discharge: 2016-08-04 | Disposition: A | Discharge status: Home / Self Care | Payer: Medicare HMO | Source: Ambulatory Visit | Attending: Orthopaedic Surgery | Admitting: Orthopaedic Surgery

## 2016-08-04 DIAGNOSIS — R531 Weakness: Secondary | ICD-10-CM

## 2016-08-08 ENCOUNTER — Ambulatory Visit (INDEPENDENT_AMBULATORY_CARE_PROVIDER_SITE_OTHER): Payer: Medicare HMO | Admitting: Internal Medicine

## 2016-08-08 ENCOUNTER — Ambulatory Visit (INDEPENDENT_AMBULATORY_CARE_PROVIDER_SITE_OTHER): Payer: Medicare HMO | Admitting: Orthopaedic Surgery

## 2016-08-08 ENCOUNTER — Encounter: Payer: Self-pay | Admitting: Internal Medicine

## 2016-08-08 VITALS — BP 124/76 | HR 78 | Temp 98.7°F | Ht 67.0 in | Wt 136.0 lb

## 2016-08-08 DIAGNOSIS — Z125 Encounter for screening for malignant neoplasm of prostate: Secondary | ICD-10-CM | POA: Diagnosis not present

## 2016-08-08 DIAGNOSIS — M545 Low back pain: Secondary | ICD-10-CM | POA: Diagnosis not present

## 2016-08-08 DIAGNOSIS — G8929 Other chronic pain: Secondary | ICD-10-CM

## 2016-08-08 DIAGNOSIS — Z0000 Encounter for general adult medical examination without abnormal findings: Secondary | ICD-10-CM

## 2016-08-08 DIAGNOSIS — E119 Type 2 diabetes mellitus without complications: Secondary | ICD-10-CM | POA: Diagnosis not present

## 2016-08-08 DIAGNOSIS — I10 Essential (primary) hypertension: Secondary | ICD-10-CM

## 2016-08-08 DIAGNOSIS — R634 Abnormal weight loss: Secondary | ICD-10-CM

## 2016-08-08 DIAGNOSIS — M549 Dorsalgia, unspecified: Secondary | ICD-10-CM

## 2016-08-08 DIAGNOSIS — E7800 Pure hypercholesterolemia, unspecified: Secondary | ICD-10-CM | POA: Diagnosis not present

## 2016-08-08 DIAGNOSIS — M1A0790 Idiopathic chronic gout, unspecified ankle and foot, without tophus (tophi): Secondary | ICD-10-CM

## 2016-08-08 LAB — COMPREHENSIVE METABOLIC PANEL
ALBUMIN: 3.9 g/dL (ref 3.5–5.2)
ALK PHOS: 56 U/L (ref 39–117)
ALT: 8 U/L (ref 0–53)
AST: 23 U/L (ref 0–37)
BUN: 5 mg/dL — ABNORMAL LOW (ref 6–23)
CALCIUM: 9.4 mg/dL (ref 8.4–10.5)
CO2: 28 mEq/L (ref 19–32)
CREATININE: 0.69 mg/dL (ref 0.40–1.50)
Chloride: 99 mEq/L (ref 96–112)
GFR: 120.56 mL/min (ref >60.00)
Glucose, Bld: 77 mg/dL (ref 70–99)
POTASSIUM: 4.6 meq/L (ref 3.5–5.1)
Sodium: 137 mEq/L (ref 135–145)
Total Bilirubin: 0.6 mg/dL (ref 0.2–1.2)
Total Protein: 6.7 g/dL (ref 6.0–8.3)

## 2016-08-08 LAB — LIPID PANEL
CHOLESTEROL: 130 mg/dL (ref 0–200)
HDL: 86.3 mg/dL (ref >39.00)
LDL Cholesterol: 33 mg/dL (ref 0–99)
NonHDL: 43.89
Total CHOL/HDL Ratio: 2
Triglycerides: 55 mg/dL (ref 0.0–149.0)
VLDL: 11 mg/dL (ref 0.0–40.0)

## 2016-08-08 LAB — HEMOGLOBIN A1C: HEMOGLOBIN A1C: 4.9 % (ref 4.6–6.5)

## 2016-08-08 LAB — PSA, MEDICARE: PSA: 0.83 ng/mL (ref 0.10–4.00)

## 2016-08-08 LAB — CBC
HEMATOCRIT: 44.5 % (ref 39.0–52.0)
Hemoglobin: 15.2 g/dL (ref 13.0–17.0)
MCHC: 34 g/dL (ref 30.0–36.0)
MCV: 100.5 fl — AB (ref 78.0–100.0)
PLATELETS: 185 10*3/uL (ref 150.0–400.0)
RBC: 4.43 Mil/uL (ref 4.22–5.81)
RDW: 15.5 % (ref 11.5–15.5)
WBC: 8.1 10*3/uL (ref 4.0–10.5)

## 2016-08-08 LAB — URIC ACID: URIC ACID, SERUM: 3.3 mg/dL — AB (ref 4.0–7.8)

## 2016-08-08 LAB — TSH: TSH: 0.6 u[IU]/mL (ref 0.35–4.50)

## 2016-08-08 NOTE — Assessment & Plan Note (Signed)
Uric acid level today Continue Allopurinol as prescribed

## 2016-08-08 NOTE — Progress Notes (Signed)
HPI:  Pt presents to the clinic today for his Medicare Wellness Exam. He is also due to follow up chronic conditions.  DM 2: His last A1C was 5%, 01/2016. He was advised to stop taking Amaryl at his las visit, because he was having episodes of hypoglycemia. His last eye exam was 11/2015. His flu shot was 06/2016. He does not take pneumonia vaccines.   HTN: BP controlled on Accupril and Toprol. His BP today is 124/76. He follows with with Dr. Juliann Pares at [facility]. ECGfrom 12/2015 reviewed.   HLD: His last LDL was 44, triglycerides 56.Marland Kitchen He is taking Fenofibrate as prescribed. He does not try to consume a low fat diet.   Gout: He has not had any flares since he started on Allopurinol. He has not had his uric acid level checked in the last year.   Chronic Back Pain: He takes Celebrex daily, but does not feel like it helps. He has established care with Dr. Ophelia Charter, and reports he has a follow up appt later today.  Past Medical History:  Diagnosis Date  . Arthritis   . Diabetes mellitus without complication (HCC)   . Hypertension   . Hypertriglyceridemia     Current Outpatient Prescriptions  Medication Sig Dispense Refill  . allopurinol (ZYLOPRIM) 300 MG tablet TAKE 1 TABLET (300 MG TOTAL) BY MOUTH DAILY. 90 tablet 0  . celecoxib (CELEBREX) 200 MG capsule Take 1 capsule (200 mg total) by mouth daily. 90 capsule 1  . fenofibrate micronized (LOFIBRA) 67 MG capsule TAKE ONE CAPSULE BY MOUTH EVERY DAY WITH SUPPER 90 capsule 1  . glucose blood (BAYER CONTOUR TEST) test strip 1 each by Other route daily as needed for other. Use as instructed 100 each 12  . metoprolol succinate (TOPROL XL) 25 MG 24 hr tablet Take 1 tablet (25 mg total) by mouth daily. (Patient not taking: Reported on 02/02/2016) 30 tablet 1  . quinapril (ACCUPRIL) 20 MG tablet Take 20 mg by mouth daily. Reported on 02/02/2016     No current facility-administered medications for this visit.     Allergies  Allergen Reactions   . Aleve [Naproxen]     Family History  Problem Relation Age of Onset  . Colon cancer Mother   . Hyperlipidemia Mother   . Hypertension Mother   . Arthritis Mother   . Arthritis Father   . Heart disease Father   . Heart disease Paternal Grandfather     Social History   Social History  . Marital status: Married    Spouse name: N/A  . Number of children: N/A  . Years of education: N/A   Occupational History  . Not on file.   Social History Main Topics  . Smoking status: Former Games developer  . Smokeless tobacco: Not on file  . Alcohol use 0.0 oz/week     Comment: beer--2 daily  . Drug use: No  . Sexual activity: Yes   Other Topics Concern  . Not on file   Social History Narrative  . No narrative on file    Hospitiliaztions: None  Health Maintenance:    Flu: 07/2015  Tetanus: < 10 years  Pneumovax: declines  Prevnar: declines  Zostavax: declines  PSA: unsure  Colon Screening: 05/2010  Eye Doctor: yearly at [facility]  Dental Exam: no, dentures   Providers:   PCP: Nicki Reaper, NP-C  Pain Management: Dr. Ophelia Charter  Cardiologist: Dr. Juliann Pares    I have personally reviewed and have noted:  1.  The patient's medical and social history 2. Their use of alcohol, tobacco or illicit drugs 3. Their current medications and supplements 4. The patient's functional ability including ADL's, fall risks, home safety risks and hearing or visual impairment. 5. Diet and physical activities 6. Evidence for depression or mood disorder  Subjective:   Review of Systems:   Constitutional: Pt reports weight loss. Denies fever, malaise, fatigue, headache.  HEENT: Denies eye pain, eye redness, ear pain, ringing in the ears, wax buildup, runny nose, nasal congestion, bloody nose, or sore throat. Respiratory: Denies difficulty breathing, shortness of breath, cough or sputum production.   Cardiovascular: Denies chest pain, chest tightness, palpitations or swelling in the  hands or feet.  Gastrointestinal: Denies abdominal pain, bloating, constipation, diarrhea or blood in the stool.  GU: Denies urgency, frequency, pain with urination, burning sensation, blood in urine, odor or discharge. Musculoskeletal: Pt reports chronic back and leg pain. Denies decrease in range of motion, difficulty with gait, muscle pain or joint swelling.  Skin: Pt reports dry skin. Denies redness, rashes, lesions or ulcercations.  Neurological: Pt reports difficulty with balance. Denies dizziness, difficulty with memory, difficulty with speech or problems with coordination.  Psych: Denies anxiety, depression, SI/HI.  No other specific complaints in a complete review of systems (except as listed in HPI above).  Objective:  PE:   BP 124/76   Pulse 78   Temp 98.7 F (37.1 C) (Oral)   Ht 5\' 7"  (1.702 m)   Wt 136 lb (61.7 kg)   SpO2 97%   BMI 21.30 kg/m   Wt Readings from Last 3 Encounters:  02/02/16 150 lb (68 kg)  01/25/16 148 lb 8 oz (67.4 kg)  01/17/16 150 lb (68 kg)    General: Appears his stated age, well developed, well nourished in NAD. Skin: Warm, dry and intact.  Neck: Neck supple, trachea midline. No masses, lumps or thyromegaly present.  Cardiovascular: Normal rate and rhythm. S1,S2 noted.  No murmur, rubs or gallops noted. No JVD or BLE edema. No carotid bruits noted. Pulmonary/Chest: Normal effort and positive vesicular breath sounds. No respiratory distress. No wheezes, rales or ronchi noted.  Abdomen: Soft and nontender. Normal bowel sounds. No distention or masses noted.  Musculoskeletal: Kyphotic. Strength 3/5 RUE, 5/5 LUE. Positive drop can on the right.  Neurological: Alert and oriented.  Psychiatric: Mood and affect normal. Behavior is normal. Judgment and thought content normal.     BMET    Component Value Date/Time   NA 137 06/20/2016 0833   K 4.7 06/20/2016 0833   CL 100 06/20/2016 0833   CO2 30 06/20/2016 0833   GLUCOSE 80 06/20/2016 0833    BUN 7 06/20/2016 0833   CREATININE 0.62 06/20/2016 0833   CALCIUM 9.1 06/20/2016 0833   GFRNONAA >60 02/02/2016 1213   GFRAA >60 02/02/2016 1213    Lipid Panel     Component Value Date/Time   CHOL 155 01/25/2016 1140   TRIG 56.0 01/25/2016 1140   HDL 99.30 01/25/2016 1140   CHOLHDL 2 01/25/2016 1140   VLDL 11.2 01/25/2016 1140   LDLCALC 44 01/25/2016 1140    CBC    Component Value Date/Time   WBC 8.1 06/20/2016 0833   RBC 4.23 06/20/2016 0833   HGB 14.3 06/20/2016 0833   HCT 42.0 06/20/2016 0833   PLT 231.0 06/20/2016 0833   MCV 99.5 06/20/2016 0833   MCH 32.8 02/02/2016 1213   MCHC 34.0 06/20/2016 0833   RDW 14.6 06/20/2016 16100833  LYMPHSABS 3.2 06/20/2016 0833   MONOABS 0.8 06/20/2016 0833   EOSABS 0.1 06/20/2016 0833   BASOSABS 0.0 06/20/2016 0833    Hgb A1C Lab Results  Component Value Date   HGBA1C 5.0 01/25/2016      Assessment and Plan:   Medicare Annual Wellness Visit:  Diet: He does eat meat. He consumes fruits and veggies daily. He does eat fried foods. He drinks mostly water. Physical activity: Sedentary secondary to back pain Depression/mood screen: Negative Hearing: Intact to whispered voice Visual acuity: Grossly normal, performs annual eye exam at [facility] ADLs: Capable Fall risk: None Home safety: Good Cognitive evaluation: Intact to orientation, naming, recall and repetition EOL planning: No adv directives, full code/ I agree  Preventative Medicine: Flu and tetanus UTD. He declines pneumovax, prevnar and zostovax. He declines repeat colonoscopy but is agreeable to Cologuard-ordered. Encouraged him to see an eye doctor annually. No need for dentist at this time. Will check CBC, CMET, Lipid, A1C, PSA, TSH today.  Loss of weight:  Secondary to poor appetite Will check TSH Family history of colon cancer, will get Cologuard  Next appointment: 1 year   Nicki Reaper, NP

## 2016-08-08 NOTE — Patient Instructions (Signed)

## 2016-08-08 NOTE — Assessment & Plan Note (Signed)
Lipid profile and CMET today Encouraged him to consume a low fat diet Continue Fenofibrate as prescribed

## 2016-08-08 NOTE — Assessment & Plan Note (Signed)
Controlled on Accupril and Toprol He will continue to follow with Dr. Fransisco Beauallworth

## 2016-08-08 NOTE — Assessment & Plan Note (Signed)
Ongoing issues He is following with Dr. Ophelia CharterYates and has a follow up appt today Continue Celebrex unless instructed otherwise by Dr. Ophelia CharterYates

## 2016-08-08 NOTE — Assessment & Plan Note (Signed)
Will repeat A1C today No microalbumin secondary to ACEI therapy Encouraged him to see an eye doctor annually Foot exam done 01/2016 Flu shot UTD He declines pneumonia vaccines

## 2016-08-22 ENCOUNTER — Other Ambulatory Visit: Payer: Self-pay | Admitting: Internal Medicine

## 2016-09-04 ENCOUNTER — Ambulatory Visit (INDEPENDENT_AMBULATORY_CARE_PROVIDER_SITE_OTHER): Payer: Medicare HMO | Admitting: Internal Medicine

## 2016-09-04 ENCOUNTER — Encounter: Payer: Self-pay | Admitting: Internal Medicine

## 2016-09-04 ENCOUNTER — Other Ambulatory Visit: Payer: Self-pay | Admitting: *Deleted

## 2016-09-04 VITALS — BP 124/52 | HR 101 | Temp 99.2°F | Wt 135.0 lb

## 2016-09-04 DIAGNOSIS — J209 Acute bronchitis, unspecified: Secondary | ICD-10-CM | POA: Diagnosis not present

## 2016-09-04 DIAGNOSIS — I7092 Chronic total occlusion of artery of the extremities: Secondary | ICD-10-CM

## 2016-09-04 MED ORDER — HYDROCODONE-HOMATROPINE 5-1.5 MG/5ML PO SYRP: 5.0000 mL | ORAL_SOLUTION | Freq: Three times a day (TID) | ORAL | 0 refills | Status: DC | PRN

## 2016-09-04 MED ORDER — AZITHROMYCIN 250 MG PO TABS: ORAL_TABLET | ORAL | 0 refills | Status: DC

## 2016-09-04 NOTE — Progress Notes (Signed)
HPI  Pt presents to the clinic today with c/o runny nose and cough. This started 2 weeks ago. He is blowing clear mucous out of his nose. The cough is productive of white/yellow mucous. He has run low grade fevers, but denies chills or body aches. He has tried Mucinex and Robitussin with minimal relief. He does feel like his symptoms have gotten worse. He has no history of seasonal allergies. He has not had sick contacts that he is aware of. His flu shot is UTD. He does not smoke.  Review of Systems        Past Medical History:  Diagnosis Date  . Arthritis   . Diabetes mellitus without complication (HCC)   . Hypertension   . Hypertriglyceridemia     Family History  Problem Relation Age of Onset  . Colon cancer Mother   . Hyperlipidemia Mother   . Hypertension Mother   . Arthritis Mother   . Arthritis Father   . Heart disease Father   . Heart disease Paternal Grandfather     Social History   Social History  . Marital status: Married    Spouse name: N/A  . Number of children: N/A  . Years of education: N/A   Occupational History  . Not on file.   Social History Main Topics  . Smoking status: Former Games developermoker  . Smokeless tobacco: Never Used  . Alcohol use 0.0 oz/week     Comment: beer--2 daily  . Drug use: No  . Sexual activity: Yes   Other Topics Concern  . Not on file   Social History Narrative  . No narrative on file    Allergies  Allergen Reactions  . Aleve [Naproxen]      Constitutional: Positive fever. Denies headache, fatigue or abrupt weight changes.  HEENT:  Positive runny nose. Denies eye redness, eye pain, pressure behind the eyes, facial pain, nasal congestion, ear pain, ringing in the ears, wax buildup or sore throat. Respiratory: Positive cough and shortness of breath. Denies difficulty breathing.  Cardiovascular: Denies chest pain, chest tightness, palpitations or swelling in the hands or feet.   No other specific complaints in a complete  review of systems (except as listed in HPI above).  Objective:   BP (!) 124/52   Pulse (!) 101   Temp 99.2 F (37.3 C) (Oral)   Wt 135 lb (61.2 kg)   SpO2 94%   BMI 21.14 kg/m   Wt Readings from Last 3 Encounters:  09/04/16 135 lb (61.2 kg)  08/08/16 136 lb (61.7 kg)  02/02/16 150 lb (68 kg)     General: Appears his stated age, in NAD. HEENT: Head: normal shape and size, no sinus tenderness noted; Eyes: sclera white, no icterus, conjunctiva pink; Ears: Tm's pink but intact, normal light reflex, +serous effusion bilaterally; Nose: mucosa pink and moist, septum midline; Throat/Mouth: + PND. Teeth present, mucosa pink and moist, no exudate noted, no lesions or ulcerations noted.  Neck: No cervical lymphadenopathy.  Cardiovascular: Normal rate and rhythm. S1,S2 noted.  No murmur, rubs or gallops noted.  Pulmonary/Chest: Normal effort and crackles noted in the right lower lobe. No respiratory distress. No wheezes, or ronchi noted.       Assessment & Plan:   Acute bronchitis:  Get some rest and drink plenty of water eRx for Azithromax x 5 days Rx for Hycodan cough syrup  RTC as needed or if symptoms persist.   Nicki ReaperBAITY, Taysha Majewski, NP

## 2016-09-04 NOTE — Patient Instructions (Signed)

## 2016-09-07 ENCOUNTER — Encounter: Payer: Self-pay | Admitting: Internal Medicine

## 2016-09-07 ENCOUNTER — Other Ambulatory Visit: Payer: Self-pay | Admitting: Internal Medicine

## 2016-09-07 MED ORDER — HYDROCODONE-HOMATROPINE 5-1.5 MG/5ML PO SYRP: 5.0000 mL | ORAL_SOLUTION | Freq: Three times a day (TID) | ORAL | 0 refills | Status: AC | PRN

## 2016-09-07 NOTE — Telephone Encounter (Signed)
Rx placed in you box for signature

## 2016-09-07 NOTE — Telephone Encounter (Signed)
[name] is still experiencing lot of coughing and congestion in chest and nose.  Concerned about the weekend coming up and not having medication.   Still have a lot of coughing and congestion. Concerned about weekend coming up and not having medication.

## 2016-09-07 NOTE — Telephone Encounter (Signed)
We can refill the Hycodan but he does not need a refill of the antibiotic. Although it is only a 5 day course, it stays in your system for 10 days.

## 2016-09-08 ENCOUNTER — Ambulatory Visit (INDEPENDENT_AMBULATORY_CARE_PROVIDER_SITE_OTHER)
Admission: RE | Admit: 2016-09-08 | Discharge: 2016-09-08 | Disposition: A | Discharge status: Home / Self Care | Payer: Medicare HMO | Source: Ambulatory Visit | Attending: Internal Medicine | Admitting: Internal Medicine

## 2016-09-08 ENCOUNTER — Encounter: Payer: Self-pay | Admitting: Internal Medicine

## 2016-09-08 ENCOUNTER — Ambulatory Visit (INDEPENDENT_AMBULATORY_CARE_PROVIDER_SITE_OTHER): Payer: Medicare HMO | Admitting: Internal Medicine

## 2016-09-08 VITALS — BP 120/70 | HR 109 | Temp 98.3°F | Wt 130.5 lb

## 2016-09-08 DIAGNOSIS — R0602 Shortness of breath: Secondary | ICD-10-CM | POA: Diagnosis not present

## 2016-09-08 DIAGNOSIS — R05 Cough: Secondary | ICD-10-CM | POA: Diagnosis not present

## 2016-09-08 DIAGNOSIS — J209 Acute bronchitis, unspecified: Secondary | ICD-10-CM

## 2016-09-08 DIAGNOSIS — R059 Cough, unspecified: Secondary | ICD-10-CM

## 2016-09-08 MED ORDER — LEVOFLOXACIN 500 MG PO TABS: 500.0000 mg | ORAL_TABLET | Freq: Every day | ORAL | 0 refills | Status: AC

## 2016-09-08 MED ORDER — PREDNISONE 10 MG PO TABS: ORAL_TABLET | ORAL | 0 refills | Status: AC

## 2016-09-08 NOTE — Progress Notes (Signed)
HPI  Pt presents to the clinic today to follow up visit from 09/04/16, when he was diagnosed with acute bronchitis. He was treated with Azithromax and Hycodan. He reports he has felt very weak, although he has noticed slight improvement in his symptoms. He continues to have runny nose, cough, chest congestion and shortness of breath. He denies fever, chills or body aches. He has not taken anything additional OTC.  Review of Systems        Past Medical History:  Diagnosis Date  . Arthritis   . Diabetes mellitus without complication (HCC)   . Hypertension   . Hypertriglyceridemia     Family History  Problem Relation Age of Onset  . Colon cancer Mother   . Hyperlipidemia Mother   . Hypertension Mother   . Arthritis Mother   . Arthritis Father   . Heart disease Father   . Heart disease Paternal Grandfather     Social History   Social History  . Marital status: Married    Spouse name: N/A  . Number of children: N/A  . Years of education: N/A   Occupational History  . Not on file.   Social History Main Topics  . Smoking status: Former Games developermoker  . Smokeless tobacco: Never Used  . Alcohol use 0.0 oz/week     Comment: beer--2 daily  . Drug use: No  . Sexual activity: Yes   Other Topics Concern  . Not on file   Social History Narrative  . No narrative on file    Allergies  Allergen Reactions  . Aleve [Naproxen]      Constitutional: Positive fatigue. Denies headache, fever or abrupt weight changes.  HEENT:  Positive runny nose. Denies eye redness, eye pain, pressure behind the eyes, facial pain, nasal congestion, ear pain, ringing in the ears, wax buildup or sore throat. Respiratory: Positive cough and shortness of breath. Denies difficulty breathing.  Cardiovascular: Denies chest pain, chest tightness, palpitations or swelling in the hands or feet.   No other specific complaints in a complete review of systems (except as listed in HPI above).  Objective:   BP  120/70   Pulse (!) 109   Temp 98.3 F (36.8 C) (Oral)   Wt 130 lb 8 oz (59.2 kg)   SpO2 97%   BMI 20.44 kg/m  Wt Readings from Last 3 Encounters:  09/08/16 130 lb 8 oz (59.2 kg)  09/04/16 135 lb (61.2 kg)  08/08/16 136 lb (61.7 kg)     General: Appears his stated age, ill appearing in NAD. HEENT: Head: normal shape and size, no sinus tenderness noted; Throat/Mouth:  Teeth present, mucosa erythematous and moist, no exudate noted, no lesions or ulcerations noted.  Neck: No cervical lymphadenopathy.  Cardiovascular: Normal rate and rhythm.  Pulmonary/Chest: Normal effort and crackles noted in bilateral bases, L>R. Expiratory wheezing noted. No respiratory distress.      Assessment & Plan:   Acute bronchitis:  Worsening Get some rest and drink plenty of water Chest xray today to r/o pneumonia Neb treatment in office eRx for Levaquin x 7 days eRx for Pred Taper x 6 days  RTC as needed or if symptoms persist.   Nicki ReaperBAITY, Lacreshia Bondarenko, NP

## 2016-09-11 ENCOUNTER — Other Ambulatory Visit: Payer: Self-pay | Admitting: Internal Medicine

## 2016-09-11 ENCOUNTER — Encounter (HOSPITAL_COMMUNITY): Payer: Self-pay | Admitting: *Deleted

## 2016-09-11 ENCOUNTER — Emergency Department (HOSPITAL_COMMUNITY): Payer: Medicare HMO

## 2016-09-11 ENCOUNTER — Inpatient Hospital Stay (HOSPITAL_COMMUNITY)
Admission: EM | Admit: 2016-09-11 | Discharge: 2016-09-22 | DRG: 208 | Disposition: E | Payer: Medicare HMO | Attending: Pulmonary Disease | Admitting: Pulmonary Disease

## 2016-09-11 DIAGNOSIS — Z452 Encounter for adjustment and management of vascular access device: Secondary | ICD-10-CM

## 2016-09-11 DIAGNOSIS — J690 Pneumonitis due to inhalation of food and vomit: Principal | ICD-10-CM | POA: Diagnosis present

## 2016-09-11 DIAGNOSIS — E876 Hypokalemia: Secondary | ICD-10-CM | POA: Diagnosis present

## 2016-09-11 DIAGNOSIS — R9389 Abnormal findings on diagnostic imaging of other specified body structures: Secondary | ICD-10-CM

## 2016-09-11 DIAGNOSIS — J9601 Acute respiratory failure with hypoxia: Secondary | ICD-10-CM | POA: Diagnosis present

## 2016-09-11 DIAGNOSIS — E871 Hypo-osmolality and hyponatremia: Secondary | ICD-10-CM | POA: Diagnosis present

## 2016-09-11 DIAGNOSIS — I472 Ventricular tachycardia: Secondary | ICD-10-CM | POA: Diagnosis present

## 2016-09-11 DIAGNOSIS — Z8261 Family history of arthritis: Secondary | ICD-10-CM

## 2016-09-11 DIAGNOSIS — Z515 Encounter for palliative care: Secondary | ICD-10-CM | POA: Diagnosis not present

## 2016-09-11 DIAGNOSIS — I469 Cardiac arrest, cause unspecified: Secondary | ICD-10-CM

## 2016-09-11 DIAGNOSIS — Z8249 Family history of ischemic heart disease and other diseases of the circulatory system: Secondary | ICD-10-CM

## 2016-09-11 DIAGNOSIS — I959 Hypotension, unspecified: Secondary | ICD-10-CM | POA: Diagnosis present

## 2016-09-11 DIAGNOSIS — E1151 Type 2 diabetes mellitus with diabetic peripheral angiopathy without gangrene: Secondary | ICD-10-CM | POA: Diagnosis present

## 2016-09-11 DIAGNOSIS — E11649 Type 2 diabetes mellitus with hypoglycemia without coma: Secondary | ICD-10-CM | POA: Diagnosis present

## 2016-09-11 DIAGNOSIS — Z79899 Other long term (current) drug therapy: Secondary | ICD-10-CM

## 2016-09-11 DIAGNOSIS — Z66 Do not resuscitate: Secondary | ICD-10-CM | POA: Diagnosis not present

## 2016-09-11 DIAGNOSIS — I4901 Ventricular fibrillation: Secondary | ICD-10-CM | POA: Diagnosis present

## 2016-09-11 DIAGNOSIS — I10 Essential (primary) hypertension: Secondary | ICD-10-CM | POA: Diagnosis present

## 2016-09-11 DIAGNOSIS — R570 Cardiogenic shock: Secondary | ICD-10-CM | POA: Diagnosis present

## 2016-09-11 DIAGNOSIS — G931 Anoxic brain damage, not elsewhere classified: Secondary | ICD-10-CM | POA: Diagnosis present

## 2016-09-11 DIAGNOSIS — Z800 Family history of malignant neoplasm of digestive organs: Secondary | ICD-10-CM

## 2016-09-11 DIAGNOSIS — F1010 Alcohol abuse, uncomplicated: Secondary | ICD-10-CM | POA: Diagnosis present

## 2016-09-11 DIAGNOSIS — F17210 Nicotine dependence, cigarettes, uncomplicated: Secondary | ICD-10-CM | POA: Diagnosis present

## 2016-09-11 DIAGNOSIS — E785 Hyperlipidemia, unspecified: Secondary | ICD-10-CM | POA: Diagnosis present

## 2016-09-11 DIAGNOSIS — K219 Gastro-esophageal reflux disease without esophagitis: Secondary | ICD-10-CM | POA: Diagnosis present

## 2016-09-11 DIAGNOSIS — I2510 Atherosclerotic heart disease of native coronary artery without angina pectoris: Secondary | ICD-10-CM | POA: Diagnosis present

## 2016-09-11 DIAGNOSIS — E1165 Type 2 diabetes mellitus with hyperglycemia: Secondary | ICD-10-CM | POA: Diagnosis not present

## 2016-09-11 DIAGNOSIS — E872 Acidosis: Secondary | ICD-10-CM | POA: Diagnosis present

## 2016-09-11 DIAGNOSIS — M109 Gout, unspecified: Secondary | ICD-10-CM | POA: Diagnosis present

## 2016-09-11 DIAGNOSIS — R911 Solitary pulmonary nodule: Secondary | ICD-10-CM | POA: Diagnosis present

## 2016-09-11 DIAGNOSIS — R569 Unspecified convulsions: Secondary | ICD-10-CM | POA: Diagnosis not present

## 2016-09-11 DIAGNOSIS — I4581 Long QT syndrome: Secondary | ICD-10-CM | POA: Diagnosis present

## 2016-09-11 DIAGNOSIS — G253 Myoclonus: Secondary | ICD-10-CM | POA: Diagnosis not present

## 2016-09-11 HISTORY — DX: Alcohol abuse, in remission: F10.11

## 2016-09-11 HISTORY — DX: Gout, unspecified: M10.9

## 2016-09-11 HISTORY — DX: Other chronic pain: G89.29

## 2016-09-11 HISTORY — DX: Hyperlipidemia, unspecified: E78.5

## 2016-09-11 HISTORY — DX: Dorsalgia, unspecified: M54.9

## 2016-09-11 LAB — BASIC METABOLIC PANEL
ANION GAP: 18 — AB (ref 5–15)
BUN: 14 mg/dL (ref 6–20)
CO2: 15 mmol/L — ABNORMAL LOW (ref 22–32)
Calcium: 8.4 mg/dL — ABNORMAL LOW (ref 8.9–10.3)
Chloride: 97 mmol/L — ABNORMAL LOW (ref 101–111)
Creatinine, Ser: 0.84 mg/dL (ref 0.61–1.24)
GFR calc Af Amer: >60 mL/min (ref >60)
Glucose, Bld: 206 mg/dL — ABNORMAL HIGH (ref 65–99)
POTASSIUM: 3.6 mmol/L (ref 3.5–5.1)
SODIUM: 130 mmol/L — AB (ref 135–145)

## 2016-09-11 LAB — I-STAT ARTERIAL BLOOD GAS, ED
Acid-base deficit: 10 mmol/L — ABNORMAL HIGH (ref 0.0–2.0)
BICARBONATE: 16.9 mmol/L — AB (ref 20.0–28.0)
O2 Saturation: 100 %
PCO2 ART: 36.6 mmHg (ref 32.0–48.0)
PO2 ART: 489 mmHg — AB (ref 83.0–108.0)
Patient temperature: 93.9
TCO2: 18 mmol/L (ref 0–100)
pH, Arterial: 7.259 — ABNORMAL LOW (ref 7.350–7.450)

## 2016-09-11 LAB — CBC
HCT: 39.4 % (ref 39.0–52.0)
HEMOGLOBIN: 13.8 g/dL (ref 13.0–17.0)
MCH: 33.8 pg (ref 26.0–34.0)
MCHC: 35 g/dL (ref 30.0–36.0)
MCV: 96.6 fL (ref 78.0–100.0)
Platelets: 352 10*3/uL (ref 150–400)
RBC: 4.08 MIL/uL — ABNORMAL LOW (ref 4.22–5.81)
RDW: 13.4 % (ref 11.5–15.5)
WBC: 33.9 10*3/uL — ABNORMAL HIGH (ref 4.0–10.5)

## 2016-09-11 LAB — I-STAT CG4 LACTIC ACID, ED: LACTIC ACID, VENOUS: 8.86 mmol/L — AB (ref 0.5–1.9)

## 2016-09-11 LAB — APTT: APTT: 42 s — AB (ref 24–36)

## 2016-09-11 LAB — I-STAT TROPONIN, ED: Troponin i, poc: 0.19 ng/mL (ref 0.00–0.08)

## 2016-09-11 LAB — PROTIME-INR
INR: 1.69
Prothrombin Time: 20.1 seconds — ABNORMAL HIGH (ref 11.4–15.2)

## 2016-09-11 MED ORDER — EPINEPHRINE PF 1 MG/ML IJ SOLN
0.5000 ug/min | INTRAMUSCULAR | Status: AC
  Administered 2016-09-11: 5 ug/min via INTRAVENOUS
  Filled 2016-09-11: qty 4

## 2016-09-11 MED ORDER — ASPIRIN 300 MG RE SUPP
300.0000 mg | RECTAL | Status: AC
  Administered 2016-09-11: 300 mg via RECTAL
  Filled 2016-09-11: qty 1

## 2016-09-11 MED ORDER — MIDAZOLAM HCL 2 MG/2ML IJ SOLN
1.0000 mg | INTRAMUSCULAR | Status: DC | PRN
  Filled 2016-09-11: qty 2

## 2016-09-11 MED ORDER — NOREPINEPHRINE BITARTRATE 1 MG/ML IV SOLN
5.0000 ug/min | INTRAVENOUS | Status: DC
  Filled 2016-09-11: qty 4

## 2016-09-11 MED ORDER — MIDAZOLAM HCL 2 MG/2ML IJ SOLN: 1.0000 mg | INTRAMUSCULAR | Status: DC | PRN

## 2016-09-11 MED ORDER — FENTANYL CITRATE (PF) 100 MCG/2ML IJ SOLN: 50.0000 ug | INTRAMUSCULAR | Status: DC | PRN

## 2016-09-11 MED ORDER — SODIUM CHLORIDE 0.9 % IV BOLUS (SEPSIS)
500.0000 mL | Freq: Once | INTRAVENOUS | Status: AC
  Administered 2016-09-11: 500 mL via INTRAVENOUS

## 2016-09-11 MED ORDER — FENTANYL CITRATE (PF) 100 MCG/2ML IJ SOLN
50.0000 ug | INTRAMUSCULAR | Status: DC | PRN
  Filled 2016-09-11: qty 2

## 2016-09-11 NOTE — ED Triage Notes (Signed)
Patient arrived via EMS.  EMS reported he was left alone for approximately 10 minutes and when the family returned found him on the floor without a heartbeat.  CPR was started , AED applied by fire and 3 shocks recommended - EMS shocked 1 more time.

## 2016-09-11 NOTE — Patient Instructions (Signed)

## 2016-09-11 NOTE — ED Provider Notes (Signed)
[facility]-EMERGENCY DEPT Provider Note   CSN: [id number] Arrival date & time: July 24, 2016  2253     History   Chief Complaint Chief Complaint  Patient presents with  . Post CPR    HPI [name] is a 70 y.o. male.  The history is provided by the EMS personnel and a relative. No language interpreter was used.   Patient is a 70 year old male with past mental history of hypertension, hyperlipidemia, type 2 diabetes, PVD with planned stents, ETOH abuse, newly diagnosed pulmonary nodule, alcohol abuse who presents after cardiac arrest. History is limited as patient is intubated and unresponsive on arrival. Further history obtained from EMS and family members. Per report patient has had an upper respiratory illness with worsening shortness of breath, cough, wheezing and sputum production for the past 2 weeks. He's been on multiple medications including a Z-Pak and a steroid taper without relief. This evening daughter brought patient's mash potatoes and he was at his baseline. She left the room for about 10 minutes and when she returned she found patient gasping for air and unresponsive. She called EMS. On fire arrival pt was found to be in a shockable rhythm. He received defibrillation 3. On EMS arrival patient was in PEA. He received an additional defibrillation 1 epi 6 and was placed on epi drip for hypotension. ROSC was achieved. A King airway was placed prior to arrival.   Past Medical History:  Diagnosis Date  . Back pain, chronic   . Diabetes mellitus without complication (HCC)   . Gout Back pain chronic  . H/O ETOH abuse   . Hyperlipidemia   . Hypertension     Patient Active Problem List   Diagnosis Date Noted  . Cardiac arrest (HCC) 09/12/2016    No past surgical history on file.   Home Medications    Prior to Admission medications   Not on File    Family History No family history on file.  Social History Social History  Substance Use Topics  . Smoking status:  Former Games developermoker  . Smokeless tobacco: Current User    Types: Chew  . Alcohol use 14.4 oz/week    24 Cans of beer per week     Comment: daily     Allergies   Aleve [naproxen sodium]   Review of Systems Review of Systems  Unable to perform ROS: Acuity of condition     Physical Exam Updated Vital Signs BP (!) 87/57   Pulse 73   Temp (!) 92.8 F (33.8 C)   Resp 24   Ht 5\' 9"  (1.753 m)   Wt 68 kg   SpO2 100%   BMI 22.15 kg/m   Physical Exam  Constitutional:  Thin, appears older than stated age, unresponsive.  HENT:  Head: Normocephalic.  Old appearing abrasions over left temple and face.  Cardiovascular: Normal rate and regular rhythm.   Femoral pulses weakly palpable. Radial pulses 2+ bilaterally.  Pulmonary/Chest:  King airway in place. Breath sounds are present but diminished bilaterally.  Abdominal:  Mild abdominal distention.  Neurological:  Does open eyes to voice. Does not follow commands. GCS 3.  Skin:  Mottled skin over bilateral lower extremities.     ED Treatments / Results  Labs (all labs ordered are listed, but only abnormal results are displayed) Labs Reviewed  BASIC METABOLIC PANEL - Abnormal; Notable for the following:       Result Value   Sodium 130 (*)    Chloride 97 (*)  CO2 15 (*)    Glucose, Bld 206 (*)    Calcium 8.4 (*)    Anion gap 18 (*)    All other components within normal limits  CBC - Abnormal; Notable for the following:    WBC 33.9 (*)    RBC 4.08 (*)    All other components within normal limits  APTT - Abnormal; Notable for the following:    aPTT 42 (*)    All other components within normal limits  PROTIME-INR - Abnormal; Notable for the following:    Prothrombin Time 20.1 (*)    All other components within normal limits  I-STAT CG4 LACTIC ACID, ED - Abnormal; Notable for the following:    Lactic Acid, Venous 8.86 (*)    All other components within normal limits  I-STAT TROPOININ, ED - Abnormal; Notable for the  following:    Troponin i, poc 0.19 (*)    All other components within normal limits  I-STAT ARTERIAL BLOOD GAS, ED - Abnormal; Notable for the following:    pH, Arterial 7.259 (*)    pO2, Arterial 489.0 (*)    Bicarbonate 16.9 (*)    Acid-base deficit 10.0 (*)    All other components within normal limits  CULTURE, RESPIRATORY (NON-EXPECTORATED)  BLOOD GAS, ARTERIAL  TROPONIN I  TROPONIN I  TROPONIN I  TROPONIN I  BASIC METABOLIC PANEL  BASIC METABOLIC PANEL  BASIC METABOLIC PANEL  BASIC METABOLIC PANEL  BASIC METABOLIC PANEL  BASIC METABOLIC PANEL  PROTIME-INR  PROTIME-INR  APTT  APTT  BLOOD GAS, ARTERIAL  BLOOD GAS, ARTERIAL  CBC  MAGNESIUM  PHOSPHORUS  BLOOD GAS, ARTERIAL  CBG MONITORING, ED    EKG  EKG Interpretation  Date/Time:  Monday Sep 23, 2016 23:01:12 EST Ventricular Rate:  70 PR Interval:    QRS Duration: 103 QT Interval:  484 QTC Calculation: 523 R Axis:   -25 Text Interpretation:  Sinus rhythm Borderline short PR interval Borderline left axis deviation Low voltage, extremity leads Anteroseptal infarct, old Repolarization abnormality, prob rate related Prolonged QT interval No previous tracing Confirmed by KNAPP  MD-J, JON ([id number]) on 09/23/16 11:21:27 PM       Radiology Dg Chest Port 1 View  Result Date: Sep 23, 2016 CLINICAL DATA:  Cardiac arrest tonight.  Intubation. EXAM: PORTABLE CHEST 1 VIEW COMPARISON:  None. FINDINGS: Endotracheal tube with tip measuring 6.2 cm above the carina. Normal heart size and pulmonary vascularity. Lungs are clear. No pneumothorax. Visualized ribs appear intact. Calcification of the aorta. IMPRESSION: Endotracheal tube tip measures 6.2 cm above the carina. No evidence of active pulmonary disease. Electronically Signed   By: Burman Nieves M.D.   On: 09/23/16 23:47    Procedures Procedure Name: Intubation Date/Time: 09/12/2016 12:19 AM Performed by: Katrinka Blazing, Mardi Cannady B Pre-anesthesia Checklist: Patient identified,  Suction available, Emergency Drugs available and Patient being monitored Preoxygenation: Pre-oxygenation with 100% oxygen Laryngoscope Size: Glidescope Grade View: Grade I Tube size: 7.5 mm Number of attempts: 1 Placement Confirmation: ETT inserted through vocal cords under direct vision,  Breath sounds checked- equal and bilateral and CO2 detector Secured at: 21 cm Tube secured with: ETT holder Comments: Patient had had a King airway placed in the field. He was pre-oxygenated with FiO2 100%. King airway was removed and replaced with endotracheal tube without difficulty.      (including critical care time)  Medications Ordered in ED Medications  sodium chloride 0.9 % bolus 1,000 mL (not administered)  cisatracurium (NIMBEX) bolus via infusion 6.8  mg (not administered)    And  cisatracurium (NIMBEX) 200 mg in sodium chloride 0.9 % 200 mL (1 mg/mL) infusion (not administered)    And  cisatracurium (NIMBEX) bolus via infusion 3.4 mg (not administered)  artificial tears (LACRILUBE) ophthalmic ointment 1 application (not administered)  heparin injection 5,000 Units (not administered)  0.9 %  sodium chloride infusion (not administered)  fentaNYL (SUBLIMAZE) injection 50 mcg (not administered)  fentaNYL 2500mcg in NS 250mL (6010mcg/ml) infusion-PREMIX (not administered)  fentaNYL (SUBLIMAZE) bolus via infusion 25 mcg (not administered)  midazolam (VERSED) injection 1 mg (not administered)  midazolam (VERSED) 50 mg in sodium chloride 0.9 % 50 mL (1 mg/mL) infusion (not administered)  midazolam (VERSED) bolus via infusion 1 mg (not administered)  famotidine (PEPCID) IVPB 20 mg premix (not administered)  EPINEPHrine (ADRENALIN) 4 mg in dextrose 5 % 250 mL (0.016 mg/mL) infusion (not administered)  iopamidol (ISOVUE-370) 76 % injection (not administered)  EPINEPHrine (ADRENALIN) 4 mg in dextrose 5 % 250 mL (0.016 mg/mL) infusion (8 mcg/min Intravenous Rate/Dose Change 12/29/15 2316)  aspirin  suppository 300 mg (300 mg Rectal Given 12/29/15 2336)  sodium chloride 0.9 % bolus 500 mL (0 mLs Intravenous Stopped 12/29/15 2346)     Initial Impression / Assessment and Plan / ED Course  I have reviewed the triage vital signs and the nursing notes.  Pertinent labs & imaging results that were available during my care of the patient were reviewed by me and considered in my medical decision making (see chart for details).  Clinical Course as of Sep 12 28  Mon Sep 11, 2016  2327 We were able to review the patient's medical record. He has history of a cardiac catheterization in April of this year. He was essentially clean with the exception of a 20% LAD lesion. Recent chest x-ray results showed a possibility of a lung mass concerning for malignancy. Considering this history and concern about the possibility of a pulmonary embolism that contributed to this cardiac arrest. We have consulted with cardiology and critical care. We will continue the epi drip.  Consider cooling.  [JK]    Clinical Course User Index [JK] Linwood DibblesJon Knapp, MD    Patient is a 70 year old male with past medical history as above who presents after cardiac arrest. Patient received multiple doses of epinephrine, defibrillation 4, before ROSC was achieved after a total of about 15 minutes of arrest, and was placed on an epi drip for hypotension prior to arrival.   On presentation patient is intubated with a King airway, GCS 3. Airway confirmed intact. Hypotensive with systolic blood pressure in the 70s. Peripheral IVs had been placed prior to arrival. Further exam as above. Patient's airway was exchanged for endotracheal tube without difficulty. He was continued on an epi drip.   EKG is normal sinus rhythm, prolonged QT, no acute ischemic changes. Cardiology was consulted. Intensivist was consulted for post cardiac arrest care. Per cardiology no indication for Cath Lab at this time. Patient had a recent cath in April 2017 which  showed 25% stenosis of LAD but no other abnormalities. Patient will be admitted to the ICU for therapeutic cooling. Family was updated on plan of care.  Patient seen and discussed with Dr. Lynelle DoctorKnapp, ED attending   Final Clinical Impressions(s) / ED Diagnoses   Final diagnoses:  Cardiac arrest [facility](HCC)    New Prescriptions New Prescriptions   No medications on file     Isa RankinAnn B Daryn Hicks, MD 09/12/16 0031

## 2016-09-11 NOTE — Consult Note (Signed)
CARDIOLOGY CONSULT NOTE   Patient ID: Joshua PostinHoward Mcdougle MRN: [id number] DOB/AGE: 69/05/1946 69 y.o.  Admit date: Sep 18, 2016  Requesting Physician: Primary Physician:   No primary care provider on file. Primary Cardiologist[facility]  [facility] Reason for Consultation:   Cardiac Arrest  HPI: [name] is a 70 y.o. male with a history significant for HTN, dyslipidemia, NIDDM, and non-obstructive CAD who [facility] after an out of hospital cardiac arrest. The pt is currently intubated and unable to provide a history, and no witnesses are available for discussion of the events, so what follows was obtained via discussion with paramedics.  Per report, the pt was seen in his normal state of health by family members and then ten minutes later was found down.  Fire dept responders arrived to the scene to the scene first, started CPR, and delivered three 200J shocks. EMS then arrived and patient was in PEA, although per report they provided one additional shock with continued CPR. Patient was given epi x6 and they had ROSC after total down time of approximately 15-20 minutes. The pt was started on an epi gtt and [facility] care.  PMH: NIDDM, HTN, dyslipidemia, gout, CAD, GERD  Family History  Problem Relation Age of Onset  . Colon cancer Mother   . Hyperlipidemia Mother   . Hypertension Mother   . Arthritis Mother   . Arthritis Father   . Heart disease Father   . Heart disease Paternal Grandfather     Social History        Social History  . Marital status: Married    Spouse name: N/A  . Number of children: N/A  . Years of education: N/A      Occupational History  . Not on file.         Social History Main Topics  . Smoking status: Former Games developermoker  . Smokeless tobacco: Never Used  . Alcohol use 0.0 oz/week     Comment: beer--2 daily  . Drug use: No  . Sexual activity: Yes       Other Topics Concern  . Not  on file      Social History Narrative  . No narrative on file        Allergies  Allergen Reactions  . Aleve [Naproxen]      I have reviewed the patient's current medications  . epinephrine       Prior to Admission medications   Not on File     Social History   Social History  . Marital status: Married    Spouse name: N/A  . Number of children: N/A  . Years of education: N/A   Occupational History  . Not on file.   Social History Main Topics  . Smoking status: Not on file  . Smokeless tobacco: Not on file  . Alcohol use Not on file  . Drug use: Unknown  . Sexual activity: Not on file   Other Topics Concern  . Not on file   Social History Narrative  . No narrative on file    No family status information on file.    ROS:  Full 14 point review of systems could not be completed given pt's mental status  Physical Exam: Resp. rate 24, SpO2 97 %.  General: intubated, not following commands Head: Eyes PERRLA, No xanthomas.   Normocephalic and atraumatic, oropharynx without edema or exudate. Dentition:  Lungs: coarse breath sounds  throughout bilateral lung fields Heart: tachycardic, RRR, no appreciable m/r/g   Neck: No carotid bruits. No lymphadenopathy.  JVD. Abdomen: Bowel sounds present, abdomen soft and non-tender without masses or hernias noted. Msk:  No spine or cva tenderness. No weakness, no joint deformities or effusions. Extremities: Cool, 1+ peripheral pulses  Neuro: Alert and oriented X 3. No focal deficits noted. Psych:  Good affect, responds appropriately Skin: No rashes or lesions noted.  Labs:  No results found for: WBC, HGB, HCT, MCV, PLT No results for input(s): INR in the last 72 hours. No results for input(s): NA, K, CL, CO2, BUN, CREATININE, CALCIUM, PROT, BILITOT, ALKPHOS, ALT, AST, GLUCOSE, ALBUMIN in the last 168 hours.  Invalid input(s): LABALBU No results found for: MG No results for input(s): CKTOTAL, CKMB, TROPONINI in the  last 72 hours. No results for input(s): TROPIPOC in the last 72 hours. No results found for: PROBNP No results found for: CHOL, HDL, LDLCALC, TRIG No results found for: DDIMER No results found for: LIPASE, AMYLASE No results found for: TSH, T4TOTAL, T3FREE, THYROIDAB No results found for: VITAMINB12, FOLATE, FERRITIN, TIBC, IRON, RETICCTPCT  Echo: Pending  Cath (02/02/16)  Dominance: Co-dominant  Left Anterior Descending  Mid LAD to Dist LAD lesion, 25% stenosed. The lesion is type C Diffuse located at the major branch. The lesion was not previously treated. Pressure wire/FFR was not performed on the lesionIVUS was not performed on the lesion.   ECG:  Per my review, NSR with normal axis and normal intervals, <571mm ST V2-V3 otherwise no acute ST or Twave changes, no pathologic Q waves  Radiology:  No results found.  ASSESSMENT AND PLAN:     Joshua PostinHoward Gros is a 70 y.o. male with a history significant for HTN, dyslipidemia, NIDDM, and non-obstructive CAD who [facility] after an out of hospital cardiac arrest.  The pt's initial presenting rhythm is unclear, but at some point he was noted to be in VT and several shocks were delivered.  ECG on arrival does not demonstrate a STEMI and otherwise is without definitive findings of acute myocardial ischemia.  Urgent coronary angiography was deferred.  # Cardiac Arrest; unclear etiology, cooling protocol per CCM - deferred cath, discussed with Dr. Okey DupreEnd - defer systemic anticoagulation and DAPT given no evidence of acute plaque rupture event - pressor support per CCM - TTE in am  # HTN - hold quinipril and metoprolol succinate given vasopressor requirements  # Dyslipidemia - okay to hold outpatient fenofibrate  # NIDDM - per CCM team  # Goals of Care; per CCM documentation, the pt's family who not pursue resuscitative measures should the pt code again  Signed: Azalee CoursePaul S Corotto, MD 01-05-16 11:13 PM

## 2016-09-11 NOTE — ED Notes (Addendum)
Ice packs applied to axillas and groins.  Numerous failed attempts for NG/OG

## 2016-09-11 NOTE — ED Provider Notes (Addendum)
Pt is a 70 y.o. male who presents with  Chief Complaint  Patient presents with  . Post CPR  Patient presented to the emergency room after a cardiac arrest at home. Patient was at home with his family. Apparently they found him down on the ground. He was last seen approximately 10 minutes previously. EMS arrived and the scene and found him to be in a shockable rhythm. Patient was shocked.  EMS arrived and continued CPR.  They inserted a King airway and started him on an epinephrine drip. They had return of spontaneous circulation  Physical Exam  Constitutional:  Unresponsive  HENT:  Head: Normocephalic and atraumatic.  King airway in place  Eyes: Conjunctivae are normal. Left eye exhibits no discharge. No scleral icterus.  Neck: No tracheal deviation present. No thyromegaly present.  Cardiovascular: Normal rate, regular rhythm and normal heart sounds.   Palpable peripheral pulses  Pulmonary/Chest: Breath sounds normal. No stridor. He has no wheezes.  Patient is breathing over the ventilator  Abdominal: He exhibits no distension. There is no tenderness. There is no rebound.  Musculoskeletal: He exhibits no tenderness or deformity.  Neurological:  GCS 3, patient does have spontaneous respirations, does not respond to painful stimuli  Skin: Skin is warm. No rash noted. He is not diaphoretic. No erythema.  Psychiatric: Affect normal.    Clinical Course as of Sep 11 2328  Mon Sep 11, 2016  2327 We were able to review the patient's medical record. He has history of a cardiac catheterization in April of this year. He was essentially clean with the exception of a 20% LAD lesion. Recent chest x-ray results showed a possibility of a lung mass concerning for malignancy. Considering this history and concern about the possibility of a pulmonary embolism that contributed to this cardiac arrest. We have consulted with cardiology and critical care. We will continue the epi drip.  Consider cooling.  [JK]     Clinical Course User Index [JK] Linwood DibblesJon Ori Kreiter, MD     EKG Interpretation  Date/Time:  Monday September 11 2016 23:01:12 EST Ventricular Rate:  70 PR Interval:    QRS Duration: 103 QT Interval:  484 QTC Calculation: 523 R Axis:   -25 Text Interpretation:  Sinus rhythm Borderline short PR interval Borderline left axis deviation Low voltage, extremity leads Anteroseptal infarct, old Repolarization abnormality, prob rate related Prolonged QT interval No previous tracing Confirmed by Tyrae Alcoser  MD-J, Stephan Nelis ([id number]) on 09/01/2016 11:21:27 PM       .Critical Care Performed by: Linwood DibblesKNAPP, Janeshia Ciliberto Authorized by: Linwood DibblesKNAPP, Ulysses Alper   Critical care provider statement:    Critical care time (minutes):  45   Critical care was necessary to treat or prevent imminent or life-threatening deterioration of the following conditions:  Cardiac failure   Critical care was time spent personally by me on the following activities:  Discussions with consultants, evaluation of patient's response to treatment, examination of patient, ordering and performing treatments and interventions, ordering and review of laboratory studies, ordering and review of radiographic studies, pulse oximetry, re-evaluation of patient's condition, obtaining history from patient or surrogate and review of old charts     Medical  examination/treatment/procedure(s) was conducted as a shared visit with resident physician and myself.  I personally evaluated the patient during the encounter.   Linwood DibblesJon Keiva Dina, MD 08/28/2016 2330 Modified attending statement.   Linwood DibblesJon Lizzette Carbonell, MD 09/12/16 [phone]

## 2016-09-11 NOTE — H&P (Signed)
PULMONARY / CRITICAL CARE MEDICINE   Name: [name] MRN: [id number] DOB: 1946-05-09    ADMISSION DATE:  09/19/2016  CHIEF COMPLAINT:  Cardiac Arrest   HISTORY OF PRESENT ILLNESS:   Patient critically ill. History obtained from family and from chart review.  [name] is 70 y.o. male with PMH of HTN, HLD, T2DM, current EtOH abuse, and PVD with planned stents who presents s/p CPR for cardiac arrest. Family found him down on the ground at home. Had previously been seen 10 minutes prior and was in his usual state. Fire arrived to the scene, started CPR and gave 3 shocks. EMS arrived, patient was in PEA. EMS gave 1 additional shock with continued CPR. Patient was given epi x6 and they had ROSC after total of approximately 15-20 minutes arrested. He was placed on an epi drip for hypotension. A King airway was placed prior to arrival in ED. In the ED, patient's airway was exchanged for ETT. Cardiology evaluated the patient and found no indication for cath lab at time of arrival. CCM was called for admission. Confirmed with family at admission that patient should be DNR status but wish to purse continued intensive care.   PAST MEDICAL HISTORY :  HTN T2DM  HLD/Non-obstructive CAD Current EtOH abuse  PVD    PAST SURGICAL HISTORY: He  has no past surgical history on file.  Allergies not on file  No current facility-administered medications on file prior to encounter.    No current outpatient prescriptions on file prior to encounter.    FAMILY HISTORY:  His has no family status information on file.    SOCIAL HISTORY: Current EtOH abuse. Drinks approximately 12 beers per day. Currently smoking a cigarette only every so often.   REVIEW OF SYSTEMS:   Sick with cough for prior two weeks. Recent fall with gash to head.   SUBJECTIVE:  Patient is intubated and obtunded. Met with family in conference room who called wife and confirmed that he would be a CNR in the event of another  cardiac arrest.   VITAL SIGNS: BP (!) 73/42 (BP Location: Left Arm)   Pulse 97   Resp 24   Ht '5\' 9"'$  (1.753 m)   Wt 150 lb (68 kg)   SpO2 100%   BMI 22.15 kg/m   HEMODYNAMICS:    VENTILATOR SETTINGS: Vent Mode: PRVC FiO2 (%):  [60 %-100 %] 60 % Set Rate:  [18 bmp-22 bmp] 22 bmp Vt Set:  [560 mL] 560 mL PEEP:  [5 cmH20] 5 cmH20 Plateau Pressure:  [14 cmH20] 14 cmH20  INTAKE / OUTPUT: No intake/output data recorded.  PHYSICAL EXAMINATION: General:  Frail, thin male unresponsive  Neuro:  Does not open eyes to voice. No response to sternal rub. Does not follow commands. Intermittently chewing on ETT. No purposeful movements.  HEENT:  ETT in place. Abrasions present over left temple and face.  Cardiovascular:  RRR. No murmurs appreciated. Femoral pulses weak bilaterally.  Lungs:  Coarse breath sounds bilaterally. No prolonged expiratory phase.  Abdomen:  NTND Musculoskeletal:  Does not move extremities.  Skin:  Mottled skin bilaterally on LE.   LABS:  BMET No results for input(s): NA, K, CL, CO2, BUN, CREATININE, GLUCOSE in the last 168 hours.  Electrolytes No results for input(s): CALCIUM, MG, PHOS in the last 168 hours.  CBC No results for input(s): WBC, HGB, HCT, PLT in the last 168 hours.  Coag's No results for input(s): APTT, INR in the last 168 hours.  Sepsis Markers  Recent Labs Lab 09/15/2016 2325  LATICACIDVEN 8.86*    ABG  Recent Labs Lab 09/06/2016 2330  PHART 7.259*  PCO2ART 36.6  PO2ART 489.0*    Liver Enzymes No results for input(s): AST, ALT, ALKPHOS, BILITOT, ALBUMIN in the last 168 hours.  Cardiac Enzymes No results for input(s): TROPONINI, PROBNP in the last 168 hours.  Glucose No results for input(s): GLUCAP in the last 168 hours.  Imaging Dg Chest Port 1 View  Result Date: 08/30/2016 CLINICAL DATA:  Cardiac arrest tonight.  Intubation. EXAM: PORTABLE CHEST 1 VIEW COMPARISON:  None. FINDINGS: Endotracheal tube with tip  measuring 6.2 cm above the carina. Normal heart size and pulmonary vascularity. Lungs are clear. No pneumothorax. Visualized ribs appear intact. Calcification of the aorta. IMPRESSION: Endotracheal tube tip measures 6.2 cm above the carina. No evidence of active pulmonary disease. Electronically Signed   By: Lucienne Capers M.D.   On: 09/03/2016 23:47     STUDIES:  CXR 11/17: New compression deformity involving lower thoracic vertebral body consistent with fracture. Left basilar nodular density is noted concerning for possible neoplasm.  CULTURES: Tracheal Culture 11/21 >>>   ANTIBIOTICS: None   SIGNIFICANT EVENTS: 11/20: Presented s/p cardiac arrest. Intubated.   LINES/TUBES: ETT 11/20 >>   DISCUSSION: [name] is 70 y.o. male with non-obstructive CAD, EtOH abuse, and HTN who presented after resuscitation from cardiac arrest outside of hospital. Patient is a previous smoker but has no known lung history. New questionable pulmonary nodule seen on CXR from three days prior to admission.   ASSESSMENT / PLAN:  PULMONARY A: Intubated for airway protection  Concern for pulmonary nodule on CXR 11/17  P:   Mechanical Ventilation  Follow ABG  CT chest   CARDIOVASCULAR A:  H/o HTN  H/o Non-obstructive CAD (25% stenosis of LAD on cath April 2017) H/o PVD  S/p cardiac arrest in field  Prolonged Qtc  Now hypotension  P:  Cardiology consulted, no indications for cath at admission  Repeat EKG in AM  Trend troponin  ASA  Epinephrine gtt  Cooling protocol: cool 24h, re-warm 12h  Tele  Echo    RENAL A:   Metabolic acidosis  P:   Monitor electrolytes, replete prn  NS at 75 cc/hr    GASTROINTESTINAL A:   Nil acute  P:   IV Pepcid    HEMATOLOGIC A:   DVT prophylaxis  P:  Monitor CBC  PT/INR  Heparin SQ  SCDs   INFECTIOUS A:   Was on Levaquin & prednisone taper prior to admit for presumed bronchitis  P:   Tracheal culture  Monitor   ENDOCRINE A:    H/o T2DM  P:   Monitor CBGs   NEUROLOGIC A:   Acute encephalopathy  Paralytic and sedation for cooling protocol  P:   Nimbex  Fentanyl gtt and prn  Versed gtt and prn  EEG  CT head  RASS goal: -5    FAMILY  - Updates: Daughter, brother, and grandson updated at admission.   - Inter-disciplinary family meet or Palliative Care meeting due by: 11/28     Phill Myron, D.O. 09/12/2016, 12:55 AM PGY-2, [place]

## 2016-09-11 NOTE — Progress Notes (Signed)
ETT positioned at 21 at the lip initially. Per CXR and MD order tube pushed down 2 cm and is now at 23 at the lip. No complications and pt stable throughout. SPO2 remained 100%.

## 2016-09-12 ENCOUNTER — Emergency Department (HOSPITAL_COMMUNITY): Payer: Medicare HMO

## 2016-09-12 ENCOUNTER — Inpatient Hospital Stay (HOSPITAL_COMMUNITY): Payer: Medicare HMO

## 2016-09-12 DIAGNOSIS — I4901 Ventricular fibrillation: Secondary | ICD-10-CM | POA: Diagnosis present

## 2016-09-12 DIAGNOSIS — Z452 Encounter for adjustment and management of vascular access device: Secondary | ICD-10-CM

## 2016-09-12 DIAGNOSIS — Z515 Encounter for palliative care: Secondary | ICD-10-CM | POA: Diagnosis not present

## 2016-09-12 DIAGNOSIS — R748 Abnormal levels of other serum enzymes: Secondary | ICD-10-CM | POA: Diagnosis not present

## 2016-09-12 DIAGNOSIS — G931 Anoxic brain damage, not elsewhere classified: Secondary | ICD-10-CM | POA: Diagnosis not present

## 2016-09-12 DIAGNOSIS — E1151 Type 2 diabetes mellitus with diabetic peripheral angiopathy without gangrene: Secondary | ICD-10-CM | POA: Diagnosis present

## 2016-09-12 DIAGNOSIS — E782 Mixed hyperlipidemia: Secondary | ICD-10-CM

## 2016-09-12 DIAGNOSIS — E876 Hypokalemia: Secondary | ICD-10-CM | POA: Diagnosis present

## 2016-09-12 DIAGNOSIS — I2510 Atherosclerotic heart disease of native coronary artery without angina pectoris: Secondary | ICD-10-CM | POA: Diagnosis present

## 2016-09-12 DIAGNOSIS — I469 Cardiac arrest, cause unspecified: Secondary | ICD-10-CM | POA: Diagnosis present

## 2016-09-12 DIAGNOSIS — I472 Ventricular tachycardia: Secondary | ICD-10-CM | POA: Diagnosis not present

## 2016-09-12 DIAGNOSIS — J9601 Acute respiratory failure with hypoxia: Secondary | ICD-10-CM

## 2016-09-12 DIAGNOSIS — J690 Pneumonitis due to inhalation of food and vomit: Secondary | ICD-10-CM | POA: Diagnosis present

## 2016-09-12 DIAGNOSIS — K219 Gastro-esophageal reflux disease without esophagitis: Secondary | ICD-10-CM | POA: Diagnosis present

## 2016-09-12 DIAGNOSIS — R570 Cardiogenic shock: Secondary | ICD-10-CM

## 2016-09-12 DIAGNOSIS — F17210 Nicotine dependence, cigarettes, uncomplicated: Secondary | ICD-10-CM | POA: Diagnosis present

## 2016-09-12 DIAGNOSIS — Z66 Do not resuscitate: Secondary | ICD-10-CM | POA: Diagnosis not present

## 2016-09-12 DIAGNOSIS — E785 Hyperlipidemia, unspecified: Secondary | ICD-10-CM | POA: Diagnosis present

## 2016-09-12 DIAGNOSIS — R9401 Abnormal electroencephalogram [EEG]: Secondary | ICD-10-CM | POA: Diagnosis not present

## 2016-09-12 DIAGNOSIS — I959 Hypotension, unspecified: Secondary | ICD-10-CM | POA: Diagnosis present

## 2016-09-12 DIAGNOSIS — I10 Essential (primary) hypertension: Secondary | ICD-10-CM

## 2016-09-12 DIAGNOSIS — E872 Acidosis: Secondary | ICD-10-CM | POA: Diagnosis present

## 2016-09-12 DIAGNOSIS — E871 Hypo-osmolality and hyponatremia: Secondary | ICD-10-CM | POA: Diagnosis present

## 2016-09-12 DIAGNOSIS — F1010 Alcohol abuse, uncomplicated: Secondary | ICD-10-CM | POA: Diagnosis present

## 2016-09-12 DIAGNOSIS — E11649 Type 2 diabetes mellitus with hypoglycemia without coma: Secondary | ICD-10-CM | POA: Diagnosis present

## 2016-09-12 DIAGNOSIS — R569 Unspecified convulsions: Secondary | ICD-10-CM | POA: Diagnosis not present

## 2016-09-12 DIAGNOSIS — G253 Myoclonus: Secondary | ICD-10-CM | POA: Diagnosis not present

## 2016-09-12 LAB — TROPONIN I
TROPONIN I: 0.82 ng/mL — AB (ref <0.03)
TROPONIN I: 0.5 ng/mL — AB (ref <0.03)
Troponin I: 0.83 ng/mL (ref <0.03)
Troponin I: 0.46 ng/mL (ref <0.03)

## 2016-09-12 LAB — POCT I-STAT 3, ART BLOOD GAS (G3+)
ACID-BASE DEFICIT: 10 mmol/L — AB (ref 0.0–2.0)
Acid-base deficit: 13 mmol/L — ABNORMAL HIGH (ref 0.0–2.0)
BICARBONATE: 14.4 mmol/L — AB (ref 20.0–28.0)
Bicarbonate: 15.4 mmol/L — ABNORMAL LOW (ref 20.0–28.0)
O2 SAT: 100 %
O2 SAT: 99 %
PCO2 ART: 28.8 mmHg — AB (ref 32.0–48.0)
PH ART: 7.254 — AB (ref 7.350–7.450)
Patient temperature: 33.5
TCO2: 16 mmol/L (ref 0–100)
TCO2: 16 mmol/L (ref 0–100)
pCO2 arterial: 30.9 mmHg — ABNORMAL LOW (ref 32.0–48.0)
pH, Arterial: 7.319 — ABNORMAL LOW (ref 7.350–7.450)
pO2, Arterial: 187 mmHg — ABNORMAL HIGH (ref 83.0–108.0)
pO2, Arterial: 112 mmHg — ABNORMAL HIGH (ref 83.0–108.0)

## 2016-09-12 LAB — COMPREHENSIVE METABOLIC PANEL
ALT: 46 U/L (ref 17–63)
AST: 71 U/L — ABNORMAL HIGH (ref 15–41)
Albumin: 1.4 g/dL — ABNORMAL LOW (ref 3.5–5.0)
Alkaline Phosphatase: 37 U/L — ABNORMAL LOW (ref 38–126)
Anion gap: 6 (ref 5–15)
BUN: 13 mg/dL (ref 6–20)
CO2: 14 mmol/L — ABNORMAL LOW (ref 22–32)
Calcium: 5.4 mg/dL — CL (ref 8.9–10.3)
Chloride: 118 mmol/L — ABNORMAL HIGH (ref 101–111)
Creatinine, Ser: 0.57 mg/dL — ABNORMAL LOW (ref 0.61–1.24)
GFR calc Af Amer: >60 mL/min (ref >60)
GFR calc non Af Amer: >60 mL/min (ref >60)
Glucose, Bld: 189 mg/dL — ABNORMAL HIGH (ref 65–99)
Potassium: 2.3 mmol/L — CL (ref 3.5–5.1)
Sodium: 138 mmol/L (ref 135–145)
Total Bilirubin: 0.4 mg/dL (ref 0.3–1.2)
Total Protein: 3.5 g/dL — ABNORMAL LOW (ref 6.5–8.1)

## 2016-09-12 LAB — BLOOD GAS, ARTERIAL
Acid-base deficit: 10.1 mmol/L — ABNORMAL HIGH (ref 0.0–2.0)
Bicarbonate: 15.5 mmol/L — ABNORMAL LOW (ref 20.0–28.0)
Drawn by: 41977
FIO2: 60
MECHVT: 560 mL
O2 Saturation: 98.1 %
PEEP: 5 cmH2O
Patient temperature: 98.6
RATE: 22 resp/min
pCO2 arterial: 35.1 mmHg (ref 32.0–48.0)
pH, Arterial: 7.267 — ABNORMAL LOW (ref 7.350–7.450)
pO2, Arterial: 171 mmHg — ABNORMAL HIGH (ref 83.0–108.0)

## 2016-09-12 LAB — GLUCOSE, CAPILLARY
GLUCOSE-CAPILLARY: 345 mg/dL — AB (ref 65–99)
GLUCOSE-CAPILLARY: 350 mg/dL — AB (ref 65–99)
GLUCOSE-CAPILLARY: 243 mg/dL — AB (ref 65–99)
GLUCOSE-CAPILLARY: 40 mg/dL — AB (ref 65–99)
GLUCOSE-CAPILLARY: 127 mg/dL — AB (ref 65–99)
GLUCOSE-CAPILLARY: 73 mg/dL (ref 65–99)
GLUCOSE-CAPILLARY: 58 mg/dL — AB (ref 65–99)
GLUCOSE-CAPILLARY: 117 mg/dL — AB (ref 65–99)
GLUCOSE-CAPILLARY: 62 mg/dL — AB (ref 65–99)
Glucose-Capillary: 366 mg/dL — ABNORMAL HIGH (ref 65–99)
Glucose-Capillary: 339 mg/dL — ABNORMAL HIGH (ref 65–99)
Glucose-Capillary: 104 mg/dL — ABNORMAL HIGH (ref 65–99)
Glucose-Capillary: 347 mg/dL — ABNORMAL HIGH (ref 65–99)
Glucose-Capillary: 306 mg/dL — ABNORMAL HIGH (ref 65–99)
Glucose-Capillary: 277 mg/dL — ABNORMAL HIGH (ref 65–99)
Glucose-Capillary: 228 mg/dL — ABNORMAL HIGH (ref 65–99)
Glucose-Capillary: 105 mg/dL — ABNORMAL HIGH (ref 65–99)
Glucose-Capillary: 60 mg/dL — ABNORMAL LOW (ref 65–99)
Glucose-Capillary: 132 mg/dL — ABNORMAL HIGH (ref 65–99)
Glucose-Capillary: 66 mg/dL (ref 65–99)
Glucose-Capillary: 109 mg/dL — ABNORMAL HIGH (ref 65–99)
Glucose-Capillary: 70 mg/dL (ref 65–99)
Glucose-Capillary: 71 mg/dL (ref 65–99)
Glucose-Capillary: 100 mg/dL — ABNORMAL HIGH (ref 65–99)
Glucose-Capillary: 65 mg/dL (ref 65–99)

## 2016-09-12 LAB — POCT I-STAT, CHEM 8
BUN: 17 mg/dL (ref 6–20)
BUN: 17 mg/dL (ref 6–20)
BUN: 18 mg/dL (ref 6–20)
BUN: 17 mg/dL (ref 6–20)
BUN: 15 mg/dL (ref 6–20)
BUN: 13 mg/dL (ref 6–20)
BUN: 15 mg/dL (ref 6–20)
CALCIUM ION: 1.17 mmol/L (ref 1.15–1.40)
CALCIUM ION: 1.16 mmol/L (ref 1.15–1.40)
CALCIUM ION: 1.08 mmol/L — AB (ref 1.15–1.40)
CALCIUM ION: 1.16 mmol/L (ref 1.15–1.40)
CHLORIDE: 96 mmol/L — AB (ref 101–111)
CHLORIDE: 97 mmol/L — AB (ref 101–111)
CHLORIDE: 97 mmol/L — AB (ref 101–111)
CHLORIDE: 99 mmol/L — AB (ref 101–111)
CHLORIDE: 103 mmol/L (ref 101–111)
CHLORIDE: 107 mmol/L (ref 101–111)
CHLORIDE: 110 mmol/L (ref 101–111)
CREATININE: 0.6 mg/dL — AB (ref 0.61–1.24)
CREATININE: 0.6 mg/dL — AB (ref 0.61–1.24)
CREATININE: 0.5 mg/dL — AB (ref 0.61–1.24)
CREATININE: 0.4 mg/dL — AB (ref 0.61–1.24)
Calcium, Ion: 1.11 mmol/L — ABNORMAL LOW (ref 1.15–1.40)
Calcium, Ion: 1.13 mmol/L — ABNORMAL LOW (ref 1.15–1.40)
Calcium, Ion: 1.13 mmol/L — ABNORMAL LOW (ref 1.15–1.40)
Creatinine, Ser: 0.6 mg/dL — ABNORMAL LOW (ref 0.61–1.24)
Creatinine, Ser: 0.6 mg/dL — ABNORMAL LOW (ref 0.61–1.24)
Creatinine, Ser: 0.5 mg/dL — ABNORMAL LOW (ref 0.61–1.24)
GLUCOSE: 331 mg/dL — AB (ref 65–99)
GLUCOSE: 287 mg/dL — AB (ref 65–99)
GLUCOSE: 240 mg/dL — AB (ref 65–99)
GLUCOSE: 214 mg/dL — AB (ref 65–99)
Glucose, Bld: 346 mg/dL — ABNORMAL HIGH (ref 65–99)
Glucose, Bld: 345 mg/dL — ABNORMAL HIGH (ref 65–99)
Glucose, Bld: 72 mg/dL (ref 65–99)
HCT: 41 % (ref 39.0–52.0)
HCT: 43 % (ref 39.0–52.0)
HCT: 40 % (ref 39.0–52.0)
HCT: 40 % (ref 39.0–52.0)
HCT: 42 % (ref 39.0–52.0)
HEMATOCRIT: 41 % (ref 39.0–52.0)
HEMATOCRIT: 42 % (ref 39.0–52.0)
HEMOGLOBIN: 13.6 g/dL (ref 13.0–17.0)
Hemoglobin: 13.9 g/dL (ref 13.0–17.0)
Hemoglobin: 14.3 g/dL (ref 13.0–17.0)
Hemoglobin: 13.9 g/dL (ref 13.0–17.0)
Hemoglobin: 14.6 g/dL (ref 13.0–17.0)
Hemoglobin: 13.6 g/dL (ref 13.0–17.0)
Hemoglobin: 14.3 g/dL (ref 13.0–17.0)
POTASSIUM: 2.3 mmol/L — AB (ref 3.5–5.1)
POTASSIUM: 2.5 mmol/L — AB (ref 3.5–5.1)
POTASSIUM: 3 mmol/L — AB (ref 3.5–5.1)
POTASSIUM: 2.8 mmol/L — AB (ref 3.5–5.1)
Potassium: 2.3 mmol/L — CL (ref 3.5–5.1)
Potassium: 3 mmol/L — ABNORMAL LOW (ref 3.5–5.1)
Potassium: 4 mmol/L (ref 3.5–5.1)
SODIUM: 132 mmol/L — AB (ref 135–145)
SODIUM: 135 mmol/L (ref 135–145)
SODIUM: 138 mmol/L (ref 135–145)
Sodium: 133 mmol/L — ABNORMAL LOW (ref 135–145)
Sodium: 134 mmol/L — ABNORMAL LOW (ref 135–145)
Sodium: 137 mmol/L (ref 135–145)
Sodium: 139 mmol/L (ref 135–145)
TCO2: 19 mmol/L (ref 0–100)
TCO2: 17 mmol/L (ref 0–100)
TCO2: 18 mmol/L (ref 0–100)
TCO2: 18 mmol/L (ref 0–100)
TCO2: 18 mmol/L (ref 0–100)
TCO2: 16 mmol/L (ref 0–100)
TCO2: 17 mmol/L (ref 0–100)

## 2016-09-12 LAB — BASIC METABOLIC PANEL
Anion gap: 14 (ref 5–15)
Anion gap: 15 (ref 5–15)
Anion gap: 6 (ref 5–15)
BUN: 16 mg/dL (ref 6–20)
BUN: 17 mg/dL (ref 6–20)
BUN: 16 mg/dL (ref 6–20)
CALCIUM: 7.5 mg/dL — AB (ref 8.9–10.3)
CHLORIDE: 99 mmol/L — AB (ref 101–111)
CO2: 17 mmol/L — ABNORMAL LOW (ref 22–32)
CO2: 16 mmol/L — ABNORMAL LOW (ref 22–32)
CO2: 15 mmol/L — ABNORMAL LOW (ref 22–32)
CREATININE: 0.66 mg/dL (ref 0.61–1.24)
Calcium: 7.7 mg/dL — ABNORMAL LOW (ref 8.9–10.3)
Calcium: 7.5 mg/dL — ABNORMAL LOW (ref 8.9–10.3)
Chloride: 98 mmol/L — ABNORMAL LOW (ref 101–111)
Chloride: 112 mmol/L — ABNORMAL HIGH (ref 101–111)
Creatinine, Ser: 1 mg/dL (ref 0.61–1.24)
Creatinine, Ser: 0.98 mg/dL (ref 0.61–1.24)
GFR calc Af Amer: >60 mL/min (ref >60)
GFR calc Af Amer: >60 mL/min (ref >60)
GFR calc Af Amer: >60 mL/min (ref >60)
GLUCOSE: 349 mg/dL — AB (ref 65–99)
GLUCOSE: 348 mg/dL — AB (ref 65–99)
GLUCOSE: 74 mg/dL (ref 65–99)
POTASSIUM: 2.3 mmol/L — AB (ref 3.5–5.1)
POTASSIUM: 2.3 mmol/L — AB (ref 3.5–5.1)
Potassium: 4.5 mmol/L (ref 3.5–5.1)
Sodium: 129 mmol/L — ABNORMAL LOW (ref 135–145)
Sodium: 130 mmol/L — ABNORMAL LOW (ref 135–145)
Sodium: 133 mmol/L — ABNORMAL LOW (ref 135–145)

## 2016-09-12 LAB — CBC
HCT: 35.7 % — ABNORMAL LOW (ref 39.0–52.0)
Hemoglobin: 12.9 g/dL — ABNORMAL LOW (ref 13.0–17.0)
MCH: 34.1 pg — ABNORMAL HIGH (ref 26.0–34.0)
MCHC: 36.1 g/dL — ABNORMAL HIGH (ref 30.0–36.0)
MCV: 94.4 fL (ref 78.0–100.0)
PLATELETS: 414 10*3/uL — AB (ref 150–400)
RBC: 3.78 MIL/uL — AB (ref 4.22–5.81)
RDW: 13.2 % (ref 11.5–15.5)
WBC: 41.5 10*3/uL — AB (ref 4.0–10.5)

## 2016-09-12 LAB — PROTIME-INR
INR: 1.85
INR: 2.08
Prothrombin Time: 21.6 seconds — ABNORMAL HIGH (ref 11.4–15.2)
Prothrombin Time: 23.7 seconds — ABNORMAL HIGH (ref 11.4–15.2)

## 2016-09-12 LAB — MRSA PCR SCREENING: MRSA BY PCR: NEGATIVE

## 2016-09-12 LAB — CBG MONITORING, ED: Glucose-Capillary: 236 mg/dL — ABNORMAL HIGH (ref 65–99)

## 2016-09-12 LAB — APTT
APTT: 37 s — AB (ref 24–36)
aPTT: 39 seconds — ABNORMAL HIGH (ref 24–36)

## 2016-09-12 LAB — MAGNESIUM: MAGNESIUM: 1.4 mg/dL — AB (ref 1.7–2.4)

## 2016-09-12 LAB — LACTIC ACID, PLASMA: LACTIC ACID, VENOUS: 2.7 mmol/L — AB (ref 0.5–1.9)

## 2016-09-12 LAB — PHOSPHORUS: Phosphorus: 3.6 mg/dL (ref 2.5–4.6)

## 2016-09-12 MED ORDER — FENTANYL 2500MCG IN NS 250ML (10MCG/ML) PREMIX INFUSION
100.0000 ug/h | INTRAVENOUS | Status: DC
  Administered 2016-09-12: 200 ug/h via INTRAVENOUS
  Administered 2016-09-12: 100 ug/h via INTRAVENOUS
  Administered 2016-09-13: 200 ug/h via INTRAVENOUS
  Administered 2016-09-13: 175 ug/h via INTRAVENOUS
  Filled 2016-09-12 (×4): qty 250

## 2016-09-12 MED ORDER — POTASSIUM CHLORIDE 10 MEQ/50ML IV SOLN
10.0000 meq | INTRAVENOUS | Status: AC
  Administered 2016-09-12 (×6): 10 meq via INTRAVENOUS
  Filled 2016-09-12 (×5): qty 50

## 2016-09-12 MED ORDER — ORAL CARE MOUTH RINSE
15.0000 mL | OROMUCOSAL | Status: DC
  Administered 2016-09-12 – 2016-09-14 (×17): 15 mL via OROMUCOSAL

## 2016-09-12 MED ORDER — DEXTROSE 50 % IV SOLN
25.0000 mL | Freq: Once | INTRAVENOUS | Status: AC
  Administered 2016-09-12: 25 mL via INTRAVENOUS

## 2016-09-12 MED ORDER — SODIUM CHLORIDE 0.9 % IV SOLN
1.0000 ug/kg/min | INTRAVENOUS | Status: DC
  Administered 2016-09-12: 1 ug/kg/min via INTRAVENOUS
  Administered 2016-09-13: 1.5 ug/kg/min via INTRAVENOUS
  Filled 2016-09-12 (×2): qty 20

## 2016-09-12 MED ORDER — POTASSIUM CHLORIDE 20 MEQ/15ML (10%) PO SOLN
40.0000 meq | ORAL | Status: AC
  Administered 2016-09-12 (×2): 40 meq via ORAL
  Filled 2016-09-12 (×2): qty 30

## 2016-09-12 MED ORDER — DEXTROSE 5 % IV SOLN
INTRAVENOUS | Status: DC
  Administered 2016-09-12 – 2016-09-13 (×2): via INTRAVENOUS

## 2016-09-12 MED ORDER — POTASSIUM CHLORIDE 20 MEQ/15ML (10%) PO SOLN
40.0000 meq | Freq: Once | ORAL | Status: AC
  Administered 2016-09-12: 40 meq
  Filled 2016-09-12: qty 30

## 2016-09-12 MED ORDER — SODIUM CHLORIDE 0.9 % IV SOLN
INTRAVENOUS | Status: DC
  Administered 2016-09-12: 1.7 [IU]/h via INTRAVENOUS
  Filled 2016-09-12: qty 2.5

## 2016-09-12 MED ORDER — ASPIRIN 81 MG PO CHEW
81.0000 mg | CHEWABLE_TABLET | Freq: Every day | ORAL | Status: DC
  Administered 2016-09-12 – 2016-09-13 (×2): 81 mg via ORAL
  Filled 2016-09-12 (×2): qty 1

## 2016-09-12 MED ORDER — SODIUM CHLORIDE 0.9 % IV SOLN
1500.0000 mg | INTRAVENOUS | Status: AC
  Administered 2016-09-12: 1500 mg via INTRAVENOUS
  Filled 2016-09-12: qty 15

## 2016-09-12 MED ORDER — ASPIRIN 300 MG RE SUPP: 300.0000 mg | Freq: Once | RECTAL | Status: DC

## 2016-09-12 MED ORDER — EPINEPHRINE PF 1 MG/ML IJ SOLN
0.5000 ug/min | INTRAVENOUS | Status: DC
  Administered 2016-09-12: 8 ug/min via INTRAVENOUS
  Filled 2016-09-12 (×2): qty 4

## 2016-09-12 MED ORDER — CISATRACURIUM BOLUS VIA INFUSION
0.1000 mg/kg | Freq: Once | INTRAVENOUS | Status: AC
  Administered 2016-09-12: 6.8 mg via INTRAVENOUS
  Filled 2016-09-12: qty 7

## 2016-09-12 MED ORDER — ARTIFICIAL TEARS OP OINT
1.0000 "application " | TOPICAL_OINTMENT | Freq: Three times a day (TID) | OPHTHALMIC | Status: DC
  Administered 2016-09-12 – 2016-09-13 (×4): 1 via OPHTHALMIC
  Filled 2016-09-12 (×2): qty 3.5

## 2016-09-12 MED ORDER — IOPAMIDOL (ISOVUE-370) INJECTION 76%
INTRAVENOUS | Status: AC
  Administered 2016-09-12: 100 mL
  Filled 2016-09-12: qty 100

## 2016-09-12 MED ORDER — FENTANYL BOLUS VIA INFUSION
25.0000 ug | INTRAVENOUS | Status: DC | PRN
  Filled 2016-09-12: qty 25

## 2016-09-12 MED ORDER — CISATRACURIUM BOLUS VIA INFUSION
0.0500 mg/kg | INTRAVENOUS | Status: DC | PRN
  Filled 2016-09-12: qty 4

## 2016-09-12 MED ORDER — SODIUM CHLORIDE 0.9 % IV SOLN
6.0000 g | Freq: Once | INTRAVENOUS | Status: AC
  Administered 2016-09-12: 6 g via INTRAVENOUS
  Filled 2016-09-12: qty 12

## 2016-09-12 MED ORDER — ORAL CARE MOUTH RINSE
15.0000 mL | Freq: Four times a day (QID) | OROMUCOSAL | Status: DC
  Administered 2016-09-13: 15 mL via OROMUCOSAL

## 2016-09-12 MED ORDER — INSULIN ASPART 100 UNIT/ML ~~LOC~~ SOLN: 2.0000 [IU] | SUBCUTANEOUS | Status: DC

## 2016-09-12 MED ORDER — FENTANYL CITRATE (PF) 100 MCG/2ML IJ SOLN: 100.0000 ug | Freq: Once | INTRAMUSCULAR | Status: DC

## 2016-09-12 MED ORDER — SODIUM CHLORIDE 0.9 % IV SOLN: INTRAVENOUS | Status: DC

## 2016-09-12 MED ORDER — FENTANYL CITRATE (PF) 100 MCG/2ML IJ SOLN
50.0000 ug | Freq: Once | INTRAMUSCULAR | Status: AC
  Administered 2016-09-12: 50 ug via INTRAVENOUS

## 2016-09-12 MED ORDER — POTASSIUM CHLORIDE 10 MEQ/50ML IV SOLN
10.0000 meq | INTRAVENOUS | Status: AC
  Administered 2016-09-12 (×4): 10 meq via INTRAVENOUS
  Filled 2016-09-12 (×4): qty 50

## 2016-09-12 MED ORDER — SODIUM CHLORIDE 0.9% FLUSH
10.0000 mL | Freq: Two times a day (BID) | INTRAVENOUS | Status: DC
Start: 2016-09-12 — End: 2016-09-14
  Administered 2016-09-12 – 2016-09-13 (×2): 10 mL

## 2016-09-12 MED ORDER — MIDAZOLAM HCL 2 MG/2ML IJ SOLN
1.0000 mg | Freq: Once | INTRAMUSCULAR | Status: AC
  Administered 2016-09-12: 1 mg via INTRAVENOUS

## 2016-09-12 MED ORDER — CHLORHEXIDINE GLUCONATE 0.12% ORAL RINSE (MEDLINE KIT)
15.0000 mL | Freq: Two times a day (BID) | OROMUCOSAL | Status: DC
  Administered 2016-09-12 – 2016-09-13 (×2): 15 mL via OROMUCOSAL

## 2016-09-12 MED ORDER — SODIUM CHLORIDE 0.9 % IV BOLUS (SEPSIS)
500.0000 mL | Freq: Once | INTRAVENOUS | Status: AC
  Administered 2016-09-12: 500 mL via INTRAVENOUS

## 2016-09-12 MED ORDER — DEXTROSE 50 % IV SOLN
INTRAVENOUS | Status: AC
  Administered 2016-09-12: 14 mL
  Filled 2016-09-12: qty 50

## 2016-09-12 MED ORDER — HEPARIN SODIUM (PORCINE) 5000 UNIT/ML IJ SOLN
5000.0000 [IU] | Freq: Three times a day (TID) | INTRAMUSCULAR | Status: DC
  Administered 2016-09-12 – 2016-09-14 (×7): 5000 [IU] via SUBCUTANEOUS
  Filled 2016-09-12 (×7): qty 1

## 2016-09-12 MED ORDER — SODIUM CHLORIDE 0.9 % IV SOLN
3.0000 g | Freq: Four times a day (QID) | INTRAVENOUS | Status: DC
  Administered 2016-09-12 – 2016-09-13 (×5): 3 g via INTRAVENOUS
  Filled 2016-09-12 (×8): qty 3

## 2016-09-12 MED ORDER — SODIUM CHLORIDE 0.9% FLUSH
10.0000 mL | INTRAVENOUS | Status: DC | PRN
Start: 2016-09-12 — End: 2016-09-14

## 2016-09-12 MED ORDER — DEXTROSE 50 % IV SOLN
INTRAVENOUS | Status: AC
Start: 2016-09-12 — End: 2016-09-12
  Filled 2016-09-12: qty 50

## 2016-09-12 MED ORDER — FAMOTIDINE IN NACL 20-0.9 MG/50ML-% IV SOLN
20.0000 mg | Freq: Two times a day (BID) | INTRAVENOUS | Status: DC
  Administered 2016-09-12 – 2016-09-13 (×4): 20 mg via INTRAVENOUS
  Filled 2016-09-12 (×4): qty 50

## 2016-09-12 MED ORDER — CHLORHEXIDINE GLUCONATE 0.12 % MT SOLN
OROMUCOSAL | Status: AC
  Administered 2016-09-12: 15 mL
  Filled 2016-09-12: qty 15

## 2016-09-12 MED ORDER — LEVETIRACETAM 500 MG/5ML IV SOLN
500.0000 mg | Freq: Two times a day (BID) | INTRAVENOUS | Status: DC
  Administered 2016-09-12: 500 mg via INTRAVENOUS
  Filled 2016-09-12 (×2): qty 5

## 2016-09-12 MED ORDER — CHLORHEXIDINE GLUCONATE 0.12% ORAL RINSE (MEDLINE KIT)
15.0000 mL | Freq: Two times a day (BID) | OROMUCOSAL | Status: DC
  Administered 2016-09-13 – 2016-09-14 (×2): 15 mL via OROMUCOSAL

## 2016-09-12 MED ORDER — INSULIN ASPART 100 UNIT/ML ~~LOC~~ SOLN
0.0000 [IU] | SUBCUTANEOUS | Status: DC
  Administered 2016-09-12 (×2): 11 [IU] via SUBCUTANEOUS

## 2016-09-12 MED ORDER — NOREPINEPHRINE BITARTRATE 1 MG/ML IV SOLN
0.0000 ug/min | INTRAVENOUS | Status: DC
  Administered 2016-09-12: 10 ug/min via INTRAVENOUS
  Administered 2016-09-13: 21 ug/min via INTRAVENOUS
  Administered 2016-09-13: 34 ug/min via INTRAVENOUS
  Filled 2016-09-12 (×4): qty 16

## 2016-09-12 MED ORDER — SODIUM CHLORIDE 0.9 % IV BOLUS (SEPSIS)
1000.0000 mL | Freq: Once | INTRAVENOUS | Status: AC
  Administered 2016-09-12: 1000 mL via INTRAVENOUS

## 2016-09-12 MED ORDER — POTASSIUM CHLORIDE 10 MEQ/50ML IV SOLN
INTRAVENOUS | Status: AC
  Filled 2016-09-12: qty 50

## 2016-09-12 MED ORDER — INSULIN GLARGINE 100 UNIT/ML ~~LOC~~ SOLN
10.0000 [IU] | SUBCUTANEOUS | Status: DC
  Filled 2016-09-12: qty 0.1

## 2016-09-12 MED ORDER — MIDAZOLAM BOLUS VIA INFUSION
1.0000 mg | INTRAVENOUS | Status: DC | PRN
  Filled 2016-09-12: qty 1

## 2016-09-12 MED ORDER — DEXTROSE 50 % IV SOLN
INTRAVENOUS | Status: AC
  Administered 2016-09-12: 19:00:00
  Filled 2016-09-12: qty 50

## 2016-09-12 MED ORDER — SODIUM CHLORIDE 0.9 % IV SOLN
2.0000 mg/h | INTRAVENOUS | Status: DC
  Administered 2016-09-12: 3 mg/h via INTRAVENOUS
  Administered 2016-09-12: 2 mg/h via INTRAVENOUS
  Administered 2016-09-13 (×2): 5 mg/h via INTRAVENOUS
  Filled 2016-09-12 (×4): qty 10

## 2016-09-12 NOTE — ED Notes (Signed)
CDS called referral number [id number]  [facility]

## 2016-09-12 NOTE — Progress Notes (Signed)
Lab results: BMET, ABG, TROP given to Dr Bary RichardFienstein. Orders received.

## 2016-09-12 NOTE — Progress Notes (Signed)
[facility] record added to patient's shadow chart at desk area and RN notified. Per EMS record collapse to first CPR time was 8 minutes; calculated collapse to ROSC time ~ 42 minutes.

## 2016-09-12 NOTE — Progress Notes (Signed)
Tried Arterial line placement right radial unsuccessful couldn't get the catheter to thread. MD Mora BellmanOni tried unsuccessful as well couldn't get catheter to thread. Patient is getting ready to transfer to 4N17.

## 2016-09-12 NOTE — Progress Notes (Signed)
PULMONARY / CRITICAL CARE MEDICINE   Name: [name] MRN: [id number] DOB: 1946-02-08    ADMISSION DATE:  September 30, 2016  CHIEF COMPLAINT:  Cardiac Arrest   HISTORY OF PRESENT ILLNESS:   Patient critically ill. History obtained from family and from chart review.  [name] is 70 y.o. male with PMH of HTN, HLD, T2DM, current EtOH abuse, and PVD with planned stents who presents s/p CPR for cardiac arrest. Family found him down on the ground at home. Had previously been seen 10 minutes prior and was in his usual state. Fire arrived to the scene, started CPR and gave 3 shocks. EMS arrived, patient was in PEA. EMS gave 1 additional shock with continued CPR. Patient was given epi x6 and they had ROSC after total of approximately 15-20 minutes arrested. He was placed on an epi drip for hypotension. A King airway was placed prior to arrival in ED. In the ED, patient's airway was exchanged for ETT. Cardiology evaluated the patient and found no indication for cath lab at time of arrival. CCM was called for admission. Confirmed with family at admission that patient should be DNR status but wish to purse continued intensive care.   SUBJECTIVE:  At goal temp, epi drip  VITAL SIGNS: BP 97/66   Pulse 68   Temp (!) 90.7 F (32.6 C) (Core (Comment))   Resp (!) 22   Ht 5\' 9"  (1.753 m)   Wt 68 kg (150 lb)   SpO2 100%   BMI 22.15 kg/m   HEMODYNAMICS: CVP:  [4 mmHg-5 mmHg] 5 mmHg  VENTILATOR SETTINGS: Vent Mode: PRVC FiO2 (%):  [60 %-100 %] 60 % Set Rate:  [18 bmp-22 bmp] 22 bmp Vt Set:  [560 mL] 560 mL PEEP:  [5 cmH20] 5 cmH20 Plateau Pressure:  [12 cmH20-14 cmH20] 12 cmH20  INTAKE / OUTPUT: I/O last 3 completed shifts: In: 3483.9 [I.V.:2733.9; IV Piggyback:750] Out: 275 [Urine:275]  PHYSICAL EXAMINATION: General:  Frail, thin male unresponsive and paralyzed Neuro:  Paralyzed, per NOT reactive pin point HEENT:  ETT in place. Abrasions present over left temple and face.   Cardiovascular:  s1 s2 RRR Lungs:  redcued  Abdomen:  NTND, soft, no r/g Musculoskeletal:  Does not move extremities. Min edema Skin:  Mottled skin bilaterally on LE.   LABS:  BMET  Recent Labs Lab 09-30-16 2303  09/12/16 0315 09/12/16 0505 09/12/16 0508 09/12/16 0655 09/12/16 0911  NA 130*  < > 129* 130* 133* 134* 135  K 3.6  < > 2.3* 2.3* 2.3* 2.5* 3.0*  CL 97*  < > 98* 99* 97* 97* 99*  CO2 15*  --  17* 16*  --   --   --   BUN 14  < > 16 17 17 18 17   CREATININE 0.84  < > 1.00 0.98 0.60* 0.60* 0.60*  GLUCOSE 206*  < > 349* 348* 345* 331* 287*  < > = values in this interval not displayed.  Electrolytes  Recent Labs Lab 2016/09/30 2303 09/12/16 0315 09/12/16 0505  CALCIUM 8.4* 7.7* 7.5*  MG  --  1.4*  --   PHOS  --  3.6  --     CBC  Recent Labs Lab September 30, 2016 2303  09/12/16 0315 09/12/16 0508 09/12/16 0655 09/12/16 0911  WBC 33.9*  --  41.5*  --   --   --   HGB 13.8  < > 12.9* 14.3 13.9 14.6  HCT 39.4  < > 35.7* 42.0 41.0 43.0  PLT 352  --  414*  --   --   --   < > = values in this interval not displayed.  Coag's  Recent Labs Lab 08/29/2016 2303 09/12/16 0315 09/12/16 0741  APTT 42* 37* 39*  INR 1.69 1.85 2.08    Sepsis Markers  Recent Labs Lab 09/10/2016 2325 09/12/16 0315  LATICACIDVEN 8.86* 2.7*    ABG  Recent Labs Lab 08/28/2016 2330 09/12/16 0350  PHART 7.259* 7.267*  PCO2ART 36.6 35.1  PO2ART 489.0* 171*    Liver Enzymes No results for input(s): AST, ALT, ALKPHOS, BILITOT, ALBUMIN in the last 168 hours.  Cardiac Enzymes  Recent Labs Lab 09/12/16 0315 09/12/16 0505  TROPONINI 0.83* 0.82*    Glucose  Recent Labs Lab 09/12/16 0508 09/12/16 0609 09/12/16 0617 09/12/16 0654 09/12/16 0741 09/12/16 0901  GLUCAP 339* 104* 350* 347* 306* 277*    Imaging Ct Head Wo Contrast  Result Date: 09/12/2016 CLINICAL DATA:  Post CPR. EXAM: CT HEAD WITHOUT CONTRAST TECHNIQUE: Contiguous axial images were obtained from the base  of the skull through the vertex without intravenous contrast. COMPARISON:  None. FINDINGS: Brain: Diffuse cerebral atrophy. No ventricular dilatation. Patchy low-attenuation changes in the deep white matter probably related to small vessel ischemia. No mass effect or midline shift. No abnormal extra-axial fluid collections. Gray-white matter junctions are mostly distinct. Basal cisterns are not effaced. No evidence of acute intracranial hemorrhage. Vascular: No hyperdense vessel or unexpected calcification. Skull: Normal. Negative for fracture or focal lesion. Sinuses/Orbits: Mucosal thickening throughout the paranasal sinuses. Mastoid air cells are not opacified. Other: None. IMPRESSION: No acute intracranial abnormalities. Chronic atrophy and small vessel ischemic changes. Electronically Signed   By: Burman NievesWilliam  Willis M.D.   On: 09/12/2016 00:53   Ct Angio Chest Pe W And/or Wo Contrast  Result Date: 09/12/2016 CLINICAL DATA:  Status post CPR, following cardiac arrest. Initial encounter. EXAM: CT ANGIOGRAPHY CHEST WITH CONTRAST TECHNIQUE: Multidetector CT imaging of the chest was performed using the standard protocol during bolus administration of intravenous contrast. Multiplanar CT image reconstructions and MIPs were obtained to evaluate the vascular anatomy. CONTRAST:  100 mL of Isovue 370 IV contrast COMPARISON:  Chest radiograph performed 08/28/2016 FINDINGS: Cardiovascular:  There is no evidence of pulmonary embolus. Diffuse coronary artery calcifications are seen. Scattered calcification is noted along the thoracic aorta and proximal great vessels. Mediastinum/Nodes: The patient's endotracheal tube is seen ending 2-3 cm above the carina. No mediastinal lymphadenopathy is seen. Trace pericardial fluid remains within normal limits. The visualized portions of the thyroid gland are unremarkable. No axillary lymphadenopathy is seen. Lungs/Pleura: Patchy bilateral airspace opacification is noted, concerning  for multifocal pneumonia or possibly pulmonary edema. No pleural effusion or pneumothorax is seen. No dominant masses are identified. Upper Abdomen: The visualized portions of the liver and spleen are unremarkable. Vague edema is suggested about the gallbladder, raising question for cholecystitis. Would correlate with the patient's symptoms. The visualized portions of the pancreas, adrenal glands and kidneys are within normal limits. Scattered calcification is noted along the proximal abdominal aorta. Musculoskeletal: No acute osseous abnormalities are identified. There is a fracture through the anterior superior endplate of T11, with depression of the fragment and surrounding degenerative change, which may reflect a subacute or chronic fracture. There is also chronic loss of height at T12 and L1. The visualized musculature is unremarkable in appearance. Review of the MIP images confirms the above findings. IMPRESSION: 1. No evidence of pulmonary embolus. 2. Patchy bilateral airspace opacification, concerning for multifocal pneumonia or possibly  pulmonary edema. 3. Diffuse coronary artery calcification. 4. Vague edema suggested about the gallbladder, raising question for cholecystitis. Would correlate with the patient's symptoms. 5. Scattered aortic atherosclerosis. 6. Fracture through the anterior superior endplate of T11, with depression of the fragment and surrounding degenerative change. This may reflect a subacute or chronic fracture. Chronic loss of height at T12 and L1. Electronically Signed   By: Roanna Raider M.D.   On: 09/12/2016 00:46   Dg Chest Port 1 View  Result Date: 09/12/2016 CLINICAL DATA:  Central line and orogastric tube placement EXAM: PORTABLE CHEST 1 VIEW COMPARISON:  None. FINDINGS: Left IJ approach central venous catheter tip overlies the lower SVC. The orogastric tube tip and side port overlie the gastric fundus. Endotracheal tube tip is below the level of the clavicles, 4.5 cm above  the inferior margin of the carina. Normal cardiomediastinal contours. There is central pulmonary vascular congestion without overt edema. IMPRESSION: 1. Left IJ central venous catheter tip overlying the lower SVC. 2. Orogastric tube tip and side port at the gastric fundus. Electronically Signed   By: Deatra Robinson M.D.   On: 09/12/2016 03:10   Dg Chest Port 1 View  Result Date: 10-08-16 CLINICAL DATA:  Cardiac arrest tonight.  Intubation. EXAM: PORTABLE CHEST 1 VIEW COMPARISON:  None. FINDINGS: Endotracheal tube with tip measuring 6.2 cm above the carina. Normal heart size and pulmonary vascularity. Lungs are clear. No pneumothorax. Visualized ribs appear intact. Calcification of the aorta. IMPRESSION: Endotracheal tube tip measures 6.2 cm above the carina. No evidence of active pulmonary disease. Electronically Signed   By: Burman Nieves M.D.   On: 10-08-2016 23:47     STUDIES:  CXR 11/17: New compression deformity involving lower thoracic vertebral body consistent with fracture. Left basilar nodular density is noted concerning for possible neoplasm.  CULTURES: Tracheal Culture 11/21 >>>   ANTIBIOTICS: Unasyn 11/20>>>  SIGNIFICANT EVENTS: 11/20: Presented s/p cardiac arrest. Intubated.   LINES/TUBES: ETT 11/20 >>  11/20 left IJ>>>  DISCUSSION: Sisto Granillo is 70 y.o. male with non-obstructive CAD, EtOH abuse, and HTN who presented after resuscitation from cardiac arrest outside of hospital. Patient is a previous smoker but has no known lung history. New questionable pulmonary nodule seen on CXR from three days prior to admission.   ASSESSMENT / PLAN:  PULMONARY A: ARF Concern for pulmonary nodule on CXR 11/17  Asp PNA P:   Mechanical Ventilation  Follow ABG noted, since at goal, will need less MV, repeat abg prior to increasing MV CT chest reviewed, neg pr, unimpressive, but pcxr worse left , asp>? pcxr in am   CARDIOVASCULAR A:  H/o HTN  H/o Non-obstructive CAD  (25% stenosis of LAD on cath April 2017) H/o PVD  S/p cardiac arrest in field  Prolonged Qtc  Now hypotension  P:   ASA  Epinephrine gtt, need to place a line Will consider transition to levophed  May add dopamine Tele  Echo awaited  Get cortisol Map goal typically 80-85 cvp noted 5, no pulm edema, bolus  RENAL A:   Metabolic acidosis  Hypok, hypo mag P:   Monitor electrolytes, replete prn  NS at 75 cc/hr  k supp aggressive, bmet q4h Mag supp  GASTROINTESTINAL A:   Nil acute  P:   IV Pepcid  Tf after rewarm LF tin am   HEMATOLOGIC A:   DVT prophylaxis  P:  Cbc in am  Heparin SQ  SCDs   INFECTIOUS A:   Concern for APS  PNA- left P:   Tracheal culture  Monitor on unasyn  ENDOCRINE A:   H/o T2DM  hyperglyemia P:   Monitor CBGs - ICU protocol Add drip  NEUROLOGIC A:   Acute encephalopathy  Paralytic and sedation for cooling protocol  P:   Nimbex  Fentanyl gtt and prn  Versed gtt and prn  EEG now RASS goal: -5    FAMILY  - Updates: Daughter, brother, and grandson updated at admission.   - Inter-disciplinary family meet or Palliative Care meeting due by: 11/28   Ccm time 35 min   Mcarthur Rossettianiel J. [name] [name], MD, FACP Pgr: [phone] [facility]

## 2016-09-12 NOTE — Progress Notes (Addendum)
CBG 66; 25 ml dextrose given per protocol. Will recheck CBG in 15 min. PT asympotmatic  Follow up CBG: 127

## 2016-09-12 NOTE — Progress Notes (Signed)
eLink Physician-Brief Progress Note Patient Name: Joshua PostinHoward Sainsbury DOB: 02/08/1946 MRN: [id number]   Date of Service  09/12/2016  HPI/Events of Note  Episodic hypoglycemia   eICU Interventions  Start d5 infusion     Intervention Category Intermediate Interventions: Other:  [name] 09/12/2016, 8:24 PM

## 2016-09-12 NOTE — Procedures (Signed)
Arterial Catheter Insertion Procedure Note [name] [id number] 11/12/1945  Procedure: Insertion of Arterial Catheter  Indications: Blood pressure monitoring and Frequent blood sampling  Procedure Details Consent: Unable to obtain consent because of emergent medical necessity. Time Out: Verified patient identification, verified procedure, site/side was marked, verified correct patient position, special equipment/implants available, medications/allergies/relevent history reviewed, required imaging and test results available.  Performed  Maximum sterile technique was used including antiseptics, cap, gloves, gown, hand hygiene, mask and sheet. Skin prep: Chlorhexidine; local anesthetic administered 20 gauge catheter was inserted into right femoral artery using the Seldinger technique.  Evaluation Blood flow poor; BP tracing poor. Complications: No apparent complications.   [name],[name]   Placed catheter, no flow, or poor, removed  [name], [name], [name] Pgr: [phone] [facility]

## 2016-09-12 NOTE — Progress Notes (Signed)
Pharmacy Antibiotic Note [name] is a 70 y.o. male admitted on 2016/01/18 with cardiac arrest. Pharmacy has been consulted for Unasyn dosing for aspiration pneumonia.  Plan: 1. Begin Unasyn 3 gram IV every 6 hours   Height: 5\' 9"  (175.3 cm) Weight: 150 lb (68 kg) IBW/kg (Calculated) : 70.7  Temp (24hrs), Avg:92.9 F (33.8 C), Min:92.5 F (33.6 C), Max:93.9 F (34.4 C)   Recent Labs Lab 2015-11-25 2303 2015-11-25 2325  WBC 33.9*  --   CREATININE 0.84  --   LATICACIDVEN  --  8.86*    Estimated Creatinine Clearance: 79.8 mL/min (by C-G formula based on SCr of 0.84 mg/dL).    Allergies  Allergen Reactions  . Aleve [Naproxen Sodium]     Unknown    Antimicrobials this admission: 11/21 Unasyn >>   Dose adjustments this admission: n/a  Microbiology results: 11/21 BCx:  11/21 Sputum:   11/21 MRSA PCR: px  Thank you for allowing pharmacy to be a part of this patient's care.  Pollyann SamplesAndy Jasmine Maceachern, PharmD, BCPS 09/12/2016, 3:06 AM Pager: [phone]

## 2016-09-12 NOTE — Procedures (Signed)
ELECTROENCEPHALOGRAM REPORT  Date of Study: 09/12/2016  Patient's Name: [name] MRN: [id number] Date of Birth: 06-Jul-1946  Referring Provider: Joneen RoachPaul Hoffman, NP  Clinical History: This is a 70 year old man found down, s/p cardiac arrest.  Medications: Versed, Fentanyl, Nimbex  Technical Summary: A multichannel digital EEG recording measured by the international 10-20 system with electrodes applied with paste and impedances below 5000 ohms performed in our laboratory with EKG monitoring in an intubated and sedated patient on hypothermia protocol at 32.8 degrees Celsius.  Hyperventilation and photic stimulation were not performed.  The digital EEG was referentially recorded, reformatted, and digitally filtered in a variety of bipolar and referential montages for optimal display.    Description: The patient is intubated and sedated on Versed and Fentanyl.There is loss of normal background activity. The record read at a sensitivity of 3 uV/mm shows diffuse suppression of background activity lasting 5 to 45 seconds in between bursts of high voltage 5 Hz theta and 8 Hz spike discharges over the bifrontal regions lasting 0.5 to 2 seconds. There is no spontaneous reactivity seen. There is muscle artifact over the frontal leads. Hyperventilation and photic stimulation were not performed. There were no electrographic seizures seen.   EKG lead was unremarkable.  Impression: This EEG is markedly abnormal due to burst-suppression pattern with bursts of bifrontal spike discharges.  Clinical Correlation: This record shows evidence of severe diffuse or bilateral cerebral dysfunction which more likely reflects the patient's history of anoxic brain injury as opposed to ictal epileptiform activity, however with concern for worsening as normothermia is achieved, long-term EEG has been started.     Patrcia DollyKaren Aquino, M.D.

## 2016-09-12 NOTE — Progress Notes (Addendum)
CBG 60; insulin stopped per protocol and rechecked 15 min later. CBG now 40; 1/2 amp Dextrose given per Glucosestabilizer order.  Follow up CBG 132

## 2016-09-12 NOTE — Procedures (Signed)
Central Venous Catheter Insertion Procedure Note Ermalene PostinHoward Brenn [id number] 07/16/1946  Procedure: Insertion of Central Venous Catheter Indications: Drug and/or fluid administration and Frequent blood sampling  Procedure Details Consent: Risks of procedure as well as the alternatives and risks of each were explained to the (patient/caregiver).  Consent for procedure obtained. Time Out: Verified patient identification, verified procedure, site/side was marked, verified correct patient position, special equipment/implants available, medications/allergies/relevent history reviewed, required imaging and test results available.  Performed  Maximum sterile technique was used including antiseptics, cap, gloves, gown, hand hygiene, mask and sheet. Skin prep: Chlorhexidine; local anesthetic administered A antimicrobial bonded/coated triple lumen catheter was placed in the left internal jugular vein using the Seldinger technique. Catheter placed to 20 cm. Blood aspirated via all 3 ports and then flushed x 3. Line sutured x 2 and dressing applied.  Ultrasound guidance used.Yes.    Evaluation Blood flow good Complications: No apparent complications Patient did tolerate procedure well. Chest X-ray ordered to verify placement.  CXR: pending.  Marcy Sirenatherine Wallace, D.O. 09/12/2016, 2:45 AM PGY-2, [facility]

## 2016-09-12 NOTE — Progress Notes (Signed)
eLink Physician-Brief Progress Note Patient Name: Joshua PostinHoward Snooks DOB: 04/04/1946 MRN: [id number]   Date of Service  09/12/2016  HPI/Events of Note  CT chest> patchy infiltrates, edema vs pneumonia WBC 33K  eICU Interventions  resp culture Start unasyn     Intervention Category Intermediate Interventions: Diagnostic test evaluation  [name] 09/12/2016, 3:00 AM

## 2016-09-12 NOTE — Progress Notes (Signed)
Patient transported to 4N17 on vent. No complications noted.

## 2016-09-12 NOTE — Progress Notes (Signed)
Patient Name: [name] Isaza Date of Encounter: 09/12/2016  Active Problems:   Cardiac arrest [facility](HCC)   Cardiogenic shock (HCC)   Acute respiratory failure with hypoxia (HCC)   Length of Stay: 0  SUBJECTIVE  Intubated, sedated, paralyzed.   CURRENT MEDS . ampicillin-sulbactam (UNASYN) IV  3 g Intravenous Q6H  . artificial tears  1 application Both Eyes Q8H  . aspirin  81 mg Oral Daily  . famotidine (PEPCID) IV  20 mg Intravenous Q12H  . heparin  5,000 Units Subcutaneous Q8H  . magnesium sulfate LVP 250-500 ml  6 g Intravenous Once  . potassium chloride  10 mEq Intravenous Q1 Hr x 6  . sodium chloride  500 mL Intravenous Once   OBJECTIVE  Vitals:   09/12/16 0700 09/12/16 0752 09/12/16 0800 09/12/16 0900  BP: 121/85 121/86 116/84 97/66  Pulse: 62 67 66 68  Resp: (!) 22 (!) 22 (!) 22 (!) 22  Temp: (!) 90.9 F (32.7 C)  (!) 90.7 F (32.6 C)   TempSrc:   Core (Comment)   SpO2: 100% 100% 100% 100%  Weight:      Height:        Intake/Output Summary (Last 24 hours) at 09/12/16 1016 Last data filed at 09/12/16 0900  Gross per 24 hour  Intake          3864.33 ml  Output              280 ml  Net          3584.33 ml   Filed Weights   01/17/16 2314  Weight: 150 lb (68 kg)    PHYSICAL EXAM  General: intubated, paralyzed, sedated Neck: Supple without bruits or JVD. Lungs:  Resp regular and unlabored, CTA. Heart: RRR no s3, s4, or murmurs. Abdomen: Soft, non-tender, non-distended, BS + x 4.  Extremities: No clubbing, cyanosis or edema. DP/PT/Radials weak peripheral pulses on LE  Accessory Clinical Findings  CBC  Recent Labs  01/17/16 2303  09/12/16 0315  09/12/16 0655 09/12/16 0911  WBC 33.9*  --  41.5*  --   --   --   HGB 13.8  < > 12.9*  < > 13.9 14.6  HCT 39.4  < > 35.7*  < > 41.0 43.0  MCV 96.6  --  94.4  --   --   --   PLT 352  --  414*  --   --   --   < > = values in this interval not displayed. Basic Metabolic Panel  Recent Labs   95/62/1311/21/17 0315 09/12/16 0505  09/12/16 0655 09/12/16 0911  NA 129* 130*  < > 134* 135  K 2.3* 2.3*  < > 2.5* 3.0*  CL 98* 99*  < > 97* 99*  CO2 17* 16*  --   --   --   GLUCOSE 349* 348*  < > 331* 287*  BUN 16 17  < > 18 17  CREATININE 1.00 0.98  < > 0.60* 0.60*  CALCIUM 7.7* 7.5*  --   --   --   MG 1.4*  --   --   --   --   PHOS 3.6  --   --   --   --   < > = values in this interval not displayed. Liver Function Tests No results for input(s): AST, ALT, ALKPHOS, BILITOT, PROT, ALBUMIN in the last 72 hours. No results for input(s): LIPASE, AMYLASE in the last 72 hours. Cardiac  Enzymes  Recent Labs  09/12/16 0315 09/12/16 0505  TROPONINI 0.83* 0.82*   BNP Invalid input(s): POCBNP D-Dimer No results for input(s): DDIMER in the last 72 hours. Hemoglobin A1C No results for input(s): HGBA1C in the last 72 hours. Fasting Lipid Panel No results for input(s): CHOL, HDL, LDLCALC, TRIG, CHOLHDL, LDLDIRECT in the last 72 hours. Thyroid Function Tests No results for input(s): TSH, T4TOTAL, T3FREE, THYROIDAB in the last 72 hours.  Invalid input(s): FREET3  Radiology/Studies  Ct Head Wo Contrast  Result Date: 09/12/2016 CLINICAL DATA:  Post CPR. EXAM: CT HEAD WITHOUT CONTRAST TECHNIQUE: Contiguous axial images were obtained from the base of the skull through the vertex without intravenous contrast. COMPARISON:  None. FINDINGS: Brain: Diffuse cerebral atrophy. No ventricular dilatation. Patchy low-attenuation changes in the deep white matter probably related to small vessel ischemia. No mass effect or midline shift. No abnormal extra-axial fluid collections. Gray-white matter junctions are mostly distinct. Basal cisterns are not effaced. No evidence of acute intracranial hemorrhage. Vascular: No hyperdense vessel or unexpected calcification. Skull: Normal. Negative for fracture or focal lesion. Sinuses/Orbits: Mucosal thickening throughout the paranasal sinuses. Mastoid air cells are not  opacified. Other: None. IMPRESSION: No acute intracranial abnormalities. Chronic atrophy and small vessel ischemic changes. Electronically Signed   By: Burman NievesWilliam  Stevens M.D.   On: 09/12/2016 00:53   Ct Angio Chest Pe W And/or Wo Contrast  Result Date: 09/12/2016 CLINICAL DATA:  Status post CPR, following cardiac arrest. Initial encounter. EXAM: CT ANGIOGRAPHY CHEST WITH CONTRAST TECHNIQUE: Multidetector CT imaging of the chest was performed using the standard protocol during bolus administration of intravenous contrast. Multiplanar CT image reconstructions and MIPs were obtained to evaluate the vascular anatomy. CONTRAST:  100 mL of Isovue 370 IV contrast COMPARISON:  Chest radiograph performed 08/31/2016 FINDINGS: Cardiovascular:  There is no evidence of pulmonary embolus. Diffuse coronary artery calcifications are seen. Scattered calcification is noted along the thoracic aorta and proximal great vessels. Mediastinum/Nodes: The patient's endotracheal tube is seen ending 2-3 cm above the carina. No mediastinal lymphadenopathy is seen. Trace pericardial fluid remains within normal limits. The visualized portions of the thyroid gland are unremarkable. No axillary lymphadenopathy is seen. Lungs/Pleura: Patchy bilateral airspace opacification is noted, concerning for multifocal pneumonia or possibly pulmonary edema. No pleural effusion or pneumothorax is seen. No dominant masses are identified. Upper Abdomen: The visualized portions of the liver and spleen are unremarkable. Vague edema is suggested about the gallbladder, raising question for cholecystitis. Would correlate with the patient's symptoms. The visualized portions of the pancreas, adrenal glands and kidneys are within normal limits. Scattered calcification is noted along the proximal abdominal aorta. Musculoskeletal: No acute osseous abnormalities are identified. There is a fracture through the anterior superior endplate of T11, with depression of the  fragment and surrounding degenerative change, which may reflect a subacute or chronic fracture. There is also chronic loss of height at T12 and L1. The visualized musculature is unremarkable in appearance. Review of the MIP images confirms the above findings. IMPRESSION: 1. No evidence of pulmonary embolus. 2. Patchy bilateral airspace opacification, concerning for multifocal pneumonia or possibly pulmonary edema. 3. Diffuse coronary artery calcification. 4. Vague edema suggested about the gallbladder, raising question for cholecystitis. Would correlate with the patient's symptoms. 5. Scattered aortic atherosclerosis. 6. Fracture through the anterior superior endplate of T11, with depression of the fragment and surrounding degenerative change. This may reflect a subacute or chronic fracture. Chronic loss of height at T12 and L1. Electronically Signed  By: Roanna Raider M.D.   On: 09/12/2016 00:46   Dg Chest Port 1 View  Result Date: 09/12/2016 CLINICAL DATA:  Central line and orogastric tube placement EXAM: PORTABLE CHEST 1 VIEW COMPARISON:  None. FINDINGS: Left IJ approach central venous catheter tip overlies the lower SVC. The orogastric tube tip and side port overlie the gastric fundus. Endotracheal tube tip is below the level of the clavicles, 4.5 cm above the inferior margin of the carina. Normal cardiomediastinal contours. There is central pulmonary vascular congestion without overt edema. IMPRESSION: 1. Left IJ central venous catheter tip overlying the lower SVC. 2. Orogastric tube tip and side port at the gastric fundus. Electronically Signed   By: Deatra Robinson M.D.   On: 09/12/2016 03:10   Dg Chest Port 1 View  Result Date: Oct 02, 2016 CLINICAL DATA:  Cardiac arrest tonight.  Intubation. EXAM: PORTABLE CHEST 1 VIEW COMPARISON:  None. FINDINGS: Endotracheal tube with tip measuring 6.2 cm above the carina. Normal heart size and pulmonary vascularity. Lungs are clear. No pneumothorax. Visualized  ribs appear intact. Calcification of the aorta. IMPRESSION: Endotracheal tube tip measures 6.2 cm above the carina. No evidence of active pulmonary disease. Electronically Signed   By: Burman Nieves M.D.   On: 10/02/2016 23:47   TELE: SR, PVCs, nsVT - 4 beats  ECG: SR, low voltage, non-specific CT-T wave abnormalities    ASSESSMENT AND PLAN  Omari Mcmanaway is a 70 y.o. male with a history significant for HTN, dyslipidemia, NIDDM, and non-obstructive CAD who presented to the [facility] ED after an out of hospital cardiac arrest.  The pt's initial presenting rhythm is unclear, but at some point he was noted to be in VT and several shocks were delivered.  ECG on arrival does not demonstrate a STEMI and otherwise is without definitive findings of acute myocardial ischemia.  Urgent coronary angiography was deferred.  # Cardiac Arrest; unclear etiology, cooling protocol per CCM - deferred cath, discussed with Dr. Okey Dupre - minimal troponin elevated most probably sec to VT and CPR - defer systemic anticoagulation and DAPT given no evidence of acute plaque rupture event - pressor support per CCM - TTE pending - we will decide on a possible cath based on neurologic recovery and TTE findings  # HTN - hold quinipril and metoprolol succinate given vasopressor requirements  # Dyslipidemia - okay to hold outpatient fenofibrate  # nsVT - replace potassium    Signed, Tobias Alexander MD, [facility] 09/12/2016

## 2016-09-12 NOTE — Progress Notes (Signed)
EEG completed, results pending. 

## 2016-09-12 NOTE — Progress Notes (Signed)
   09/12/16 0700  Clinical Encounter Type  Visited With Family  Visit Type ED;Critical Care;Initial;Spiritual support;Social support  Referral From Nurse  Spiritual Encounters  Spiritual Needs Emotional;Grief support  Ch called to be with family following CPR on pt; pt now in cold Cool on 4N; CH available as needed.Erline LevineMichael I Tyrin Herbers

## 2016-09-12 NOTE — Progress Notes (Signed)
eLink Physician-Brief Progress Note Patient Name: [name] DOB: 03/04/1946 MRN: [id number]   Date of Service  09/12/2016  HPI/Events of Note  Critically low K in setting of therapeutic hypothermia  eICU Interventions  Replete K again, IV 60mEq and oral 40 mEq     Intervention Category Major Interventions: Electrolyte abnormality - evaluation and management  Max FickleDouglas McQuaid 09/12/2016, 6:34 AM

## 2016-09-12 NOTE — Progress Notes (Signed)
Hypoglycemic Event  CBG: 58  Treatment: 1/2 amp dextrose Symptoms: none Follow-up CBG:  Time:  CBG Result      Joshua FillerLeary, [name]

## 2016-09-12 NOTE — Consult Note (Signed)
NEURO HOSPITALIST CONSULT NOTE   Requestig physician: Dr. Elsworth Soho   Reason for Consult: seizure post rewarming   History obtained from:  Chart     HPI:                                                                                                                                          [name] is an 70 y.o. male PMH of HTN, HLD, T2DM, current EtOH abuse, and PVD with planned stents who presents s/p CPR for cardiac arrest. Family found him down on the ground at home. Had previously been seen 10 minutes prior and was in his usual state. Fire arrived to the scene, started CPR and gave 3 shocks. EMS arrived, patient was in PEA. EMS gave 1 additional shock with continued CPR. Patient was given epi x6 and they had ROSC after total of approximately 15-20 minutes arrested. He was placed on an epi drip for hypotension. A King airway was placed prior to arrival in ED. In the ED, patient's airway was exchanged for ETT. Cardiology evaluated the patient and found no indication for cath lab at time of arrival.  Lesion was placed on hypothermic protocol. Patient is currently still on hypothermic protocol with plan to rewarm early tomorrow morning. Concern for seizure was had thus EEG was obtained. As patient is being rewarmed on long-term EEG he is showing intermittent sharps concerns are that he may go into status as he is warm to further. Neurology was consultative for these findings and further management of epileptiform activity noted on EEG and clinical exam.  Currently patient is sedated and completely paralyzed.  Labs of interest: Hypokalemia with potassium of 2.3 Blood glucose ranging between 345-189 Hypocalcemia of 5.4 AST 71 ALT 46  Past Medical History:  Diagnosis Date  . Back pain, chronic   . Diabetes mellitus without complication ([place])   . Gout Back pain chronic  . H/O ETOH abuse   . Hyperlipidemia   . Hypertension     No past surgical history on file.  No  family history on file.    Social History:  reports that he has quit smoking. His smokeless tobacco use includes Chew. He reports that he drinks about 14.4 oz of alcohol per week . He reports that he does not use drugs.  Allergies  Allergen Reactions  . Aleve [Naproxen Sodium]     Unknown    MEDICATIONS:  Prior to Admission:  Prescriptions Prior to Admission  Medication Sig Dispense Refill Last Dose  . allopurinol (ZYLOPRIM) 300 MG tablet Take 300 mg by mouth daily.   Unknown at Unknown  . calcitonin, salmon, (MIACALCIN/FORTICAL) 200 UNIT/ACT nasal spray Place 1 spray into alternate nostrils daily.   Unknown at Unknown  . celecoxib (CELEBREX) 200 MG capsule Take 200 mg by mouth daily.   Unknown at Unknown  . fenofibrate micronized (LOFIBRA) 67 MG capsule Take 67 mg by mouth daily.   Unknown at Unknown  . metoprolol succinate (TOPROL-XL) 25 MG 24 hr tablet Take 25 mg by mouth daily.   Unknown at Unknown  . quinapril (ACCUPRIL) 20 MG tablet Take 20 mg by mouth daily.  3 Unknown at Unknown  . Teriparatide, Recombinant, (FORTEO) 600 MCG/2.4ML SOLN Inject 20 mcg into the skin daily.   Unknown at Unknown   Scheduled: . ampicillin-sulbactam (UNASYN) IV  3 g Intravenous Q6H  . artificial tears  1 application Both Eyes C4U  . aspirin  81 mg Oral Daily  . chlorhexidine gluconate (MEDLINE KIT)  15 mL Mouth Rinse BID  . famotidine (PEPCID) IV  20 mg Intravenous Q12H  . heparin  5,000 Units Subcutaneous Q8H  . levETIRAcetam  1,500 mg Intravenous STAT  . levETIRAcetam  500 mg Intravenous Q12H  . magnesium sulfate LVP 250-500 ml  6 g Intravenous Once  . mouth rinse  15 mL Mouth Rinse 10 times per day  . potassium chloride  40 mEq Oral Q4H  . sodium chloride flush  10-40 mL Intracatheter Q12H     ROS:                                                                                                                                        History obtained from unobtainable from patient due to Intubated and sedated    Blood pressure 109/81, pulse 69, temperature (!) 92.1 F (33.4 C), temperature source Core (Comment), resp. rate (!) 30, height _0  (1.753 m), weight 68 kg (150 lb), SpO2 100 %.   Neurologic Examination:                                                                                                      HEENT-  Normocephalic, no lesions, without obvious abnormality.  Normal external eye and conjunctiva.  Normal TM's bilaterally.  Normal auditory canals and external ears. Normal external nose, mucus membranes and septum.  Normal pharynx. Cardiovascular- S1, S2 normal,  pulses palpable throughout   Lungs- chest clear, no wheezing, rales, normal symmetric air entry Abdomen- normal findings: bowel sounds normal Extremities- no edema Lymph-no adenopathy palpable Musculoskeletal-no joint tenderness, deformity or swelling Skin-warm and dry, no hyperpigmentation, vitiligo, or suspicious lesions  Neurological Examination Patient at this time is intubated and completely sedated along with being paralyzed. Extremities are flaccid. Patient is not breathing over the vent. There are no deep tendon reflexes. Further examination will need to be had once patient is off paralytics      Lab Results: Basic Metabolic Panel:  Recent Labs Lab 09/12/2016 2303  09/12/16 0315 09/12/16 0505  09/12/16 0655 09/12/16 0911 09/12/16 1047 09/12/16 1140 09/12/16 1152  NA 130*  < > 129* 130*  < > 134* 135 137 138 139  K 3.6  < > 2.3* 2.3*  < > 2.5* 3.0* 3.0* 2.3* 2.8*  CL 97*  < > 98* 99*  < > 97* 99* 103 118* 107  CO2 15*  --  17* 16*  --   --   --   --  14*  --   GLUCOSE 206*  < > 349* 348*  < > 331* 287* 240* 189* 214*  BUN 14  < > 16 17  < > _0 CREATININE 0.84  < > 1.00 0.98  < > 0.60* 0.60* 0.50* 0.57* 0.50*  CALCIUM 8.4*  --  7.7* 7.5*  --   --   --   --   5.4*  --   MG  --   --  1.4*  --   --   --   --   --   --   --   PHOS  --   --  3.6  --   --   --   --   --   --   --   < > = values in this interval not displayed.  Liver Function Tests:  Recent Labs Lab 09/12/16 1140  AST 71*  ALT 46  ALKPHOS 37*  BILITOT 0.4  PROT 3.5*  ALBUMIN 1.4*   No results for input(s): LIPASE, AMYLASE in the last 168 hours. No results for input(s): AMMONIA in the last 168 hours.  CBC:  Recent Labs Lab 09/07/2016 2303  09/12/16 0315 09/12/16 0508 09/12/16 0655 09/12/16 0911 09/12/16 1047 09/12/16 1152  WBC 33.9*  --  41.5*  --   --   --   --   --   HGB 13.8  < > 12.9* 14.3 13.9 14.6 13.6 13.6  HCT 39.4  < > 35.7* 42.0 41.0 43.0 40.0 40.0  MCV 96.6  --  94.4  --   --   --   --   --   PLT 352  --  414*  --   --   --   --   --   < > = values in this interval not displayed.  Cardiac Enzymes:  Recent Labs Lab 09/12/16 0315 09/12/16 0505 09/12/16 1140  TROPONINI 0.83* 0.82* 0.46*    Lipid Panel: No results for input(s): CHOL, TRIG, HDL, CHOLHDL, VLDL, LDLCALC in the last 168 hours.  CBG:  Recent Labs Lab 09/12/16 0741 09/12/16 0901 09/12/16 1043 09/12/16 1140 09/12/16 1409  GLUCAP 306* 277* 228* 243* 105*    Microbiology: Results for orders placed or performed during the hospital encounter of 08/31/2016  MRSA PCR Screening     Status: None   Collection Time: 09/12/16  2:00 AM  Result Value Ref Range Status   MRSA by PCR NEGATIVE NEGATIVE Final    Comment:        The GeneXpert MRSA Assay (FDA approved for NASAL specimens only), is one component of a comprehensive MRSA colonization surveillance program. It is not intended to diagnose MRSA infection nor to guide or monitor treatment for MRSA infections.     Coagulation Studies:  Recent Labs  09/21/2016 2303 09/12/16 0315 09/12/16 0741  LABPROT 20.1* 21.6* 23.7*  INR 1.69 1.85 2.08    Imaging: Ct Head Wo Contrast  Result Date: 09/12/2016 CLINICAL DATA:  Post  CPR. EXAM: CT HEAD WITHOUT CONTRAST TECHNIQUE: Contiguous axial images were obtained from the base of the skull through the vertex without intravenous contrast. COMPARISON:  None. FINDINGS: Brain: Diffuse cerebral atrophy. No ventricular dilatation. Patchy low-attenuation changes in the deep white matter probably related to small vessel ischemia. No mass effect or midline shift. No abnormal extra-axial fluid collections. Gray-white matter junctions are mostly distinct. Basal cisterns are not effaced. No evidence of acute intracranial hemorrhage. Vascular: No hyperdense vessel or unexpected calcification. Skull: Normal. Negative for fracture or focal lesion. Sinuses/Orbits: Mucosal thickening throughout the paranasal sinuses. Mastoid air cells are not opacified. Other: None. IMPRESSION: No acute intracranial abnormalities. Chronic atrophy and small vessel ischemic changes. Electronically Signed   By: Lucienne Capers M.D.   On: 09/12/2016 00:53   Ct Angio Chest Pe W And/or Wo Contrast  Result Date: 09/12/2016 CLINICAL DATA:  Status post CPR, following cardiac arrest. Initial encounter. EXAM: CT ANGIOGRAPHY CHEST WITH CONTRAST TECHNIQUE: Multidetector CT imaging of the chest was performed using the standard protocol during bolus administration of intravenous contrast. Multiplanar CT image reconstructions and MIPs were obtained to evaluate the vascular anatomy. CONTRAST:  100 mL of Isovue 370 IV contrast COMPARISON:  Chest radiograph performed 08/27/2016 FINDINGS: Cardiovascular:  There is no evidence of pulmonary embolus. Diffuse coronary artery calcifications are seen. Scattered calcification is noted along the thoracic aorta and proximal great vessels. Mediastinum/Nodes: The patient's endotracheal tube is seen ending 2-3 cm above the carina. No mediastinal lymphadenopathy is seen. Trace pericardial fluid remains within normal limits. The visualized portions of the thyroid gland are unremarkable. No axillary  lymphadenopathy is seen. Lungs/Pleura: Patchy bilateral airspace opacification is noted, concerning for multifocal pneumonia or possibly pulmonary edema. No pleural effusion or pneumothorax is seen. No dominant masses are identified. Upper Abdomen: The visualized portions of the liver and spleen are unremarkable. Vague edema is suggested about the gallbladder, raising question for cholecystitis. Would correlate with the patient's symptoms. The visualized portions of the pancreas, adrenal glands and kidneys are within normal limits. Scattered calcification is noted along the proximal abdominal aorta. Musculoskeletal: No acute osseous abnormalities are identified. There is a fracture through the anterior superior endplate of O03, with depression of the fragment and surrounding degenerative change, which may reflect a subacute or chronic fracture. There is also chronic loss of height at T12 and L1. The visualized musculature is unremarkable in appearance. Review of the MIP images confirms the above findings. IMPRESSION: 1. No evidence of pulmonary embolus. 2. Patchy bilateral airspace opacification, concerning for multifocal pneumonia or possibly pulmonary edema. 3. Diffuse coronary artery calcification. 4. Vague edema suggested about the gallbladder, raising question for cholecystitis. Would correlate with the patient's symptoms. 5. Scattered aortic atherosclerosis. 6. Fracture through the anterior superior endplate of O12, with depression of the fragment and surrounding degenerative change. This may reflect a subacute or chronic fracture. Chronic loss of height  at T12 and L1. Electronically Signed   By: Garald Balding M.D.   On: 09/12/2016 00:46   Dg Chest Port 1 View  Result Date: 09/12/2016 CLINICAL DATA:  Central line and orogastric tube placement EXAM: PORTABLE CHEST 1 VIEW COMPARISON:  None. FINDINGS: Left IJ approach central venous catheter tip overlies the lower SVC. The orogastric tube tip and side port  overlie the gastric fundus. Endotracheal tube tip is below the level of the clavicles, 4.5 cm above the inferior margin of the carina. Normal cardiomediastinal contours. There is central pulmonary vascular congestion without overt edema. IMPRESSION: 1. Left IJ central venous catheter tip overlying the lower SVC. 2. Orogastric tube tip and side port at the gastric fundus. Electronically Signed   By: Ulyses Jarred M.D.   On: 09/12/2016 03:10   Dg Chest Port 1 View  Result Date: 09/20/2016 CLINICAL DATA:  Cardiac arrest tonight.  Intubation. EXAM: PORTABLE CHEST 1 VIEW COMPARISON:  None. FINDINGS: Endotracheal tube with tip measuring 6.2 cm above the carina. Normal heart size and pulmonary vascularity. Lungs are clear. No pneumothorax. Visualized ribs appear intact. Calcification of the aorta. IMPRESSION: Endotracheal tube tip measures 6.2 cm above the carina. No evidence of active pulmonary disease. Electronically Signed   By: Lucienne Capers M.D.   On: 09/12/2016 23:47       Assessment and plan per attending neurologist  Etta Quill PA-C [facility] [phone]  09/12/2016, 2:39 PM   Assessment/Plan: This is a 70 year old male brought to the hospital after suffering PDA. Patient was down approximate 20 minutes. Patient was then placed on the cooling protocol and still on cooling protocol with plan to be rewarmed early this morning.  Patient is currently hooked up to EEG and shows intermittent sharp wave activity that is getting more pronounced further into the rewarming process patient goes. At this point the concern is he may go into an NCSE. At this time will bolus patient with 1500 mg of Keppra with 500 mg Keppra maintenance twice a day. Patient will remain on LTM for now. We will continue to follow this patient.

## 2016-09-12 NOTE — Progress Notes (Signed)
Pt achieved 33 degrees at 03:00, MD aware.

## 2016-09-12 NOTE — Progress Notes (Addendum)
Hypoglycemic Event  CBG: 65   Treatment: 14 ml D50 per glucose stablilizer  Symptoms: None (pt is intubated and sedated  Follow-up CBG: Time: 2220 CBG Result: 100  Possible Reasons for Event: Inadequate meal intake  Elink MD notified of constant fluctuation in blood sugar.  D5 gtt increased to 50 ml/hr    Ria CommentKatelyn M Roop

## 2016-09-12 NOTE — Progress Notes (Signed)
eLink Physician-Brief Progress Note Patient Name: Ermalene PostinHoward Ilagan DOB: 12/25/1945 MRN: [id number]   Date of Service  09/12/2016  HPI/Events of Note  Hypokalemia Making urine hyperglycemia  eICU Interventions  Replace K Start SSI     Intervention Category Intermediate Interventions: Electrolyte abnormality - evaluation and management;Hyperglycemia - evaluation and treatment  Max FickleDouglas Alvin Rubano 09/12/2016, 3:22 AM

## 2016-09-12 NOTE — Progress Notes (Signed)
VLTM EEG running. No skin breakdown. Notified reading physician

## 2016-09-12 NOTE — Procedures (Signed)
Arterial Catheter Insertion Procedure Note [name] [id number] 02/20/1946  Procedure: Insertion of Arterial Catheter  Indications: Blood pressure monitoring and Frequent blood sampling  Procedure Details Consent: Unable to obtain consent because of emergent medical necessity. Time Out: Verified patient identification, verified procedure, site/side was marked, verified correct patient position, special equipment/implants available, medications/allergies/relevent history reviewed, required imaging and test results available.  Performed  Maximum sterile technique was used including antiseptics, cap, gloves, gown, hand hygiene, mask and sheet. Skin prep: Chlorhexidine; local anesthetic administered 20 gauge catheter was inserted into left femoral artery using the Seldinger technique.  Evaluation Blood flow good; BP tracing good. Complications: No apparent complications.   [name],[name]. 09/12/2016

## 2016-09-12 NOTE — Progress Notes (Addendum)
Hypoglycemic Event  CBG: 62  Treatment: 15 ml (per glucose stabilizer)  Symptoms: None (pt intubated and sedated)  Follow-up CBG: Time: 2125 CBG Result: 117  Possible Reasons for Event: Inadequate meal intake   Elink MD notified and RN requested D5 gtt for fluctuation in blood sugar.  MD gave order for D5 gtt at 10 mL/hr    Caryn BeeKatelyn M Roop

## 2016-09-12 NOTE — Progress Notes (Signed)
Initial Nutrition Assessment  DOCUMENTATION CODES:   Severe malnutrition in context of chronic illness  INTERVENTION:   Once patient has been rewarmed recommend initiating TF and advancing slowly.  Vital AF 1.2 @ 30 ml/hr and increase by 10 ml every 12 hours to goal rate of 60 ml/hr (1440 ml/day) Provides: 1728 kcal, 108 grams protein, and 1167 ml H2O.   Once TF started monitor magnesium, potassium, and phosphorus daily for at least 3 days, MD to replete as needed, as pt is at risk for refeeding syndrome given severe malnutrition.   NUTRITION DIAGNOSIS:   Malnutrition (Severe) related to chronic illness (ETOH, possible lung ca) as evidenced by severe depletion of muscle mass, severe depletion of body fat.  GOAL:   Patient will meet greater than or equal to 90% of their needs  MONITOR:   I & O's, Vent status, Labs  REASON FOR ASSESSMENT:   Ventilator    ASSESSMENT:   Pt with PMH of HTN, HLD, T2DM, current EtOH abuse, and PVD with planned stents who presents s/p CPR for cardiac arrest.  No family present. Per RN discussion with family pt had not been eating much lately. 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 No pulses in feet per RN which was an issue PTA.  On arctic sun protocol, per RN will start rewarming process at 3 am 11/22.  Left basilar nodular density is noted concerning for possible neoplasm per xray  Patient is currently intubated on ventilator support MV: 11.9 L/min Temp (24hrs), Avg:92 F (33.3 C), Min:89.2 F (31.8 C), Max:93.9 F (34.4 C)  Medications reviewed and include: mag sulfate, KCl Labs reviewed: K+ 3.0 CBG's: 347-306-277 OG tip in gastric fundus Nutrition-Focused physical exam completed. Findings are severe fat depletion, severe muscle depletion, and no edema.    Diet Order:     Skin:  Reviewed, no issues  Last BM:  unknown  Height:   Ht Readings from Last 1 Encounters:   08/28/2016 5\' 9"  (1.753 m)    Weight:   Wt Readings from Last 1 Encounters:  09/01/2016 150 lb (68 kg)    Ideal Body Weight:  72.7 kg  BMI:  Body mass index is 22.15 kg/m.  Estimated Nutritional Needs:   Kcal:  1718  Protein:  85-100 grams  Fluid:  > 1.7 L/day  EDUCATION NEEDS:   No education needs identified at this time  Kendell BaneHeather Kalynne Womac RD, LDN, CNSC [phone] Pager [phone] After Hours Pager

## 2016-09-13 ENCOUNTER — Inpatient Hospital Stay (HOSPITAL_COMMUNITY): Payer: Medicare HMO

## 2016-09-13 DIAGNOSIS — I472 Ventricular tachycardia: Secondary | ICD-10-CM

## 2016-09-13 DIAGNOSIS — I469 Cardiac arrest, cause unspecified: Secondary | ICD-10-CM

## 2016-09-13 LAB — GLUCOSE, CAPILLARY
GLUCOSE-CAPILLARY: 77 mg/dL (ref 65–99)
GLUCOSE-CAPILLARY: 94 mg/dL (ref 65–99)
GLUCOSE-CAPILLARY: 75 mg/dL (ref 65–99)
GLUCOSE-CAPILLARY: 82 mg/dL (ref 65–99)
Glucose-Capillary: 109 mg/dL — ABNORMAL HIGH (ref 65–99)
Glucose-Capillary: 226 mg/dL — ABNORMAL HIGH (ref 65–99)
Glucose-Capillary: 51 mg/dL — ABNORMAL LOW (ref 65–99)
Glucose-Capillary: 55 mg/dL — ABNORMAL LOW (ref 65–99)
Glucose-Capillary: 72 mg/dL (ref 65–99)
Glucose-Capillary: 75 mg/dL (ref 65–99)
Glucose-Capillary: 84 mg/dL (ref 65–99)
Glucose-Capillary: 88 mg/dL (ref 65–99)
Glucose-Capillary: 91 mg/dL (ref 65–99)

## 2016-09-13 LAB — POCT I-STAT 3, ART BLOOD GAS (G3+)
ACID-BASE DEFICIT: 12 mmol/L — AB (ref 0.0–2.0)
BICARBONATE: 14.1 mmol/L — AB (ref 20.0–28.0)
O2 Saturation: 97 %
TCO2: 15 mmol/L (ref 0–100)
pCO2 arterial: 31.2 mmHg — ABNORMAL LOW (ref 32.0–48.0)
pH, Arterial: 7.261 — ABNORMAL LOW (ref 7.350–7.450)
pO2, Arterial: 103 mmHg (ref 83.0–108.0)

## 2016-09-13 LAB — CBC WITH DIFFERENTIAL/PLATELET
BASOS ABS: 0 10*3/uL (ref 0.0–0.1)
Basophils Relative: 0 %
EOS ABS: 0 10*3/uL (ref 0.0–0.7)
Eosinophils Relative: 0 %
HCT: 38.8 % — ABNORMAL LOW (ref 39.0–52.0)
Hemoglobin: 14.1 g/dL (ref 13.0–17.0)
LYMPHS ABS: 3.4 10*3/uL (ref 0.7–4.0)
Lymphocytes Relative: 9 %
MCH: 34.1 pg — AB (ref 26.0–34.0)
MCHC: 36.3 g/dL — AB (ref 30.0–36.0)
MCV: 93.7 fL (ref 78.0–100.0)
Monocytes Absolute: 1.9 10*3/uL — ABNORMAL HIGH (ref 0.1–1.0)
Monocytes Relative: 5 %
NEUTROS ABS: 32.6 10*3/uL — AB (ref 1.7–7.7)
Neutrophils Relative %: 86 %
PLATELETS: 328 10*3/uL (ref 150–400)
RBC: 4.14 MIL/uL — AB (ref 4.22–5.81)
RDW: 13.6 % (ref 11.5–15.5)
WBC: 37.9 10*3/uL — AB (ref 4.0–10.5)

## 2016-09-13 LAB — BLOOD GAS, ARTERIAL
Acid-base deficit: 10.1 mmol/L — ABNORMAL HIGH (ref 0.0–2.0)
Bicarbonate: 14.6 mmol/L — ABNORMAL LOW (ref 20.0–28.0)
Drawn by: 46203
FIO2: 40
O2 Saturation: 98.5 %
PCO2 ART: 28.2 mmHg — AB (ref 32.0–48.0)
PEEP: 5 cmH2O
Patient temperature: 98.6
RATE: 30 resp/min
VT: 560 mL
pH, Arterial: 7.335 — ABNORMAL LOW (ref 7.350–7.450)
pO2, Arterial: 162 mmHg — ABNORMAL HIGH (ref 83.0–108.0)

## 2016-09-13 LAB — MAGNESIUM: Magnesium: 2.3 mg/dL (ref 1.7–2.4)

## 2016-09-13 LAB — COMPREHENSIVE METABOLIC PANEL
ALBUMIN: 2 g/dL — AB (ref 3.5–5.0)
ALK PHOS: 52 U/L (ref 38–126)
ALT: 52 U/L (ref 17–63)
ANION GAP: 6 (ref 5–15)
AST: 61 U/L — AB (ref 15–41)
BILIRUBIN TOTAL: 0.3 mg/dL (ref 0.3–1.2)
BUN: 14 mg/dL (ref 6–20)
CALCIUM: 7.4 mg/dL — AB (ref 8.9–10.3)
CO2: 16 mmol/L — ABNORMAL LOW (ref 22–32)
Chloride: 112 mmol/L — ABNORMAL HIGH (ref 101–111)
Creatinine, Ser: 0.67 mg/dL (ref 0.61–1.24)
GFR calc Af Amer: >60 mL/min (ref >60)
GLUCOSE: 94 mg/dL (ref 65–99)
Potassium: 4.6 mmol/L (ref 3.5–5.1)
Sodium: 134 mmol/L — ABNORMAL LOW (ref 135–145)
TOTAL PROTEIN: 4.7 g/dL — AB (ref 6.5–8.1)

## 2016-09-13 LAB — POCT I-STAT, CHEM 8
BUN: 14 mg/dL (ref 6–20)
BUN: 16 mg/dL (ref 6–20)
CALCIUM ION: 1.14 mmol/L — AB (ref 1.15–1.40)
CALCIUM ION: 1.18 mmol/L (ref 1.15–1.40)
Chloride: 110 mmol/L (ref 101–111)
Chloride: 108 mmol/L (ref 101–111)
Creatinine, Ser: 0.6 mg/dL — ABNORMAL LOW (ref 0.61–1.24)
Creatinine, Ser: 0.7 mg/dL (ref 0.61–1.24)
Glucose, Bld: 83 mg/dL (ref 65–99)
Glucose, Bld: 85 mg/dL (ref 65–99)
HCT: 45 % (ref 39.0–52.0)
HEMATOCRIT: 46 % (ref 39.0–52.0)
HEMOGLOBIN: 15.6 g/dL (ref 13.0–17.0)
Hemoglobin: 15.3 g/dL (ref 13.0–17.0)
Potassium: 4.4 mmol/L (ref 3.5–5.1)
Potassium: 5.2 mmol/L — ABNORMAL HIGH (ref 3.5–5.1)
SODIUM: 137 mmol/L (ref 135–145)
SODIUM: 135 mmol/L (ref 135–145)
TCO2: 16 mmol/L (ref 0–100)
TCO2: 16 mmol/L (ref 0–100)

## 2016-09-13 LAB — CORTISOL: CORTISOL PLASMA: 10.4 ug/dL

## 2016-09-13 LAB — ECHOCARDIOGRAM COMPLETE
Height: 69 in
Weight: 2400 oz

## 2016-09-13 MED ORDER — SODIUM CHLORIDE 0.9 % IV SOLN
1000.0000 mg | Freq: Two times a day (BID) | INTRAVENOUS | Status: DC
  Administered 2016-09-13 (×2): 1000 mg via INTRAVENOUS
  Filled 2016-09-13 (×4): qty 10

## 2016-09-13 MED ORDER — SODIUM CHLORIDE 0.9 % IV SOLN
200.0000 mg | Freq: Two times a day (BID) | INTRAVENOUS | Status: DC
  Administered 2016-09-13 – 2016-09-14 (×3): 200 mg via INTRAVENOUS
  Filled 2016-09-13 (×6): qty 20

## 2016-09-13 MED ORDER — DEXTROSE 50 % IV SOLN
INTRAVENOUS | Status: AC
  Administered 2016-09-13: 50 mL
  Filled 2016-09-13: qty 50

## 2016-09-13 MED ORDER — LORAZEPAM 2 MG/ML IJ SOLN
4.0000 mg | INTRAMUSCULAR | Status: AC
  Administered 2016-09-13: 4 mg via INTRAVENOUS
  Filled 2016-09-13: qty 2

## 2016-09-13 MED ORDER — PERFLUTREN LIPID MICROSPHERE
INTRAVENOUS | Status: AC
  Administered 2016-09-13: 2 mL via INTRAVENOUS
  Filled 2016-09-13: qty 10

## 2016-09-13 MED ORDER — FENTANYL CITRATE (PF) 100 MCG/2ML IJ SOLN: 25.0000 ug | INTRAMUSCULAR | Status: DC | PRN

## 2016-09-13 MED ORDER — INSULIN GLARGINE 100 UNIT/ML ~~LOC~~ SOLN
5.0000 [IU] | SUBCUTANEOUS | Status: DC
  Filled 2016-09-13: qty 0.05

## 2016-09-13 MED ORDER — VALPROATE SODIUM 500 MG/5ML IV SOLN
1500.0000 mg | Freq: Once | INTRAVENOUS | Status: AC
  Administered 2016-09-13: 1500 mg via INTRAVENOUS
  Filled 2016-09-13: qty 15

## 2016-09-13 MED ORDER — SODIUM CHLORIDE 0.9 % IV SOLN
1.5000 g | Freq: Four times a day (QID) | INTRAVENOUS | Status: DC
  Administered 2016-09-13 – 2016-09-14 (×3): 1.5 g via INTRAVENOUS
  Filled 2016-09-13 (×6): qty 1.5

## 2016-09-13 MED ORDER — LEVETIRACETAM 500 MG/5ML IV SOLN
1000.0000 mg | Freq: Once | INTRAVENOUS | Status: AC
  Administered 2016-09-13: 1000 mg via INTRAVENOUS
  Filled 2016-09-13: qty 10

## 2016-09-13 MED ORDER — DEXTROSE 10 % IV SOLN
INTRAVENOUS | Status: DC
  Administered 2016-09-13: via INTRAVENOUS

## 2016-09-13 MED ORDER — VALPROATE SODIUM 500 MG/5ML IV SOLN
500.0000 mg | Freq: Three times a day (TID) | INTRAVENOUS | Status: DC
  Administered 2016-09-13 – 2016-09-14 (×4): 500 mg via INTRAVENOUS
  Filled 2016-09-13 (×7): qty 5

## 2016-09-13 MED ORDER — PERFLUTREN LIPID MICROSPHERE
1.0000 mL | INTRAVENOUS | Status: AC | PRN
  Administered 2016-09-13: 2 mL via INTRAVENOUS
  Filled 2016-09-13: qty 10

## 2016-09-13 MED ORDER — HYDROCORTISONE NA SUCCINATE PF 100 MG IJ SOLR
50.0000 mg | Freq: Four times a day (QID) | INTRAMUSCULAR | Status: DC
  Administered 2016-09-13 – 2016-09-14 (×4): 50 mg via INTRAVENOUS
  Filled 2016-09-13 (×4): qty 2

## 2016-09-13 NOTE — Progress Notes (Signed)
  Echocardiogram 2D Echocardiogram with Definity has been performed.  Tye SavoyCasey N Eyvette Cordon 09/13/2016, 12:17 PM

## 2016-09-13 NOTE — Progress Notes (Signed)
Checked Pt's head for skin breakdown; fixed and reprepped a few electrodes, but no breakdown was seen.

## 2016-09-13 NOTE — Progress Notes (Signed)
Patient Name: [name] Date of Encounter: 09/13/2016  Active Problems:   Cardiac arrest [facility])   Cardiogenic shock ([place])   Acute respiratory failure with hypoxia ([place])   Encounter for central line placement   Length of Stay: 1  SUBJECTIVE  Intubated, sedated, paralyzed.   CURRENT MEDS . ampicillin-sulbactam (UNASYN) IV  3 g Intravenous Q6H  . artificial tears  1 application Both Eyes R1H  . aspirin  81 mg Oral Daily  . chlorhexidine gluconate (MEDLINE KIT)  15 mL Mouth Rinse BID  . famotidine (PEPCID) IV  20 mg Intravenous Q12H  . heparin  5,000 Units Subcutaneous Q8H  . insulin aspart  2-6 Units Subcutaneous Q4H  . insulin glargine  10 Units Subcutaneous Q24H  . lacosamide (VIMPAT) IV  200 mg Intravenous Q12H  . levETIRAcetam  1,000 mg Intravenous Q12H  . mouth rinse  15 mL Mouth Rinse 10 times per day  . mouth rinse  15 mL Mouth Rinse QID  . sodium chloride flush  10-40 mL Intracatheter Q12H  . valproate sodium  500 mg Intravenous Q8H   . cisatracurium (NIMBEX) infusion 1.5 mcg/kg/min (09/13/16 0908)  . dextrose 50 mL/hr at 09/12/16 2319  . epinephrine 4 mcg/min (09/12/16 1200)  . fentaNYL infusion INTRAVENOUS 200 mcg/hr (09/13/16 0800)  . midazolam (VERSED) infusion 5 mg/hr (09/13/16 0800)  . norepinephrine (LEVOPHED) Adult infusion 31 mcg/min (09/13/16 0800)   OBJECTIVE  Vitals:   09/13/16 0715 09/13/16 0740 09/13/16 0800 09/13/16 0900  BP:  123/62 110/77 106/77  Pulse: 84 83 81   Resp: (!) 30  (!) 30 (!) 30  Temp:   (!) 94.6 F (34.8 C) (!) 94.3 F (34.6 C)  TempSrc:      SpO2: 100%  100%   Weight:      Height:        Intake/Output Summary (Last 24 hours) at 09/13/16 1004 Last data filed at 09/13/16 0900  Gross per 24 hour  Intake          4279.91 ml  Output             1205 ml  Net          3074.91 ml   Filed Weights   08/31/2016 2314  Weight: 150 lb (68 kg)    PHYSICAL EXAM  General: intubated, paralyzed, sedated Neck: Supple  without bruits or JVD. Lungs:  Resp regular and unlabored, CTA. Heart: RRR no s3, s4, or murmurs. Abdomen: Soft, non-tender, non-distended, BS + x 4.  Extremities: No clubbing, cyanosis or edema. DP/PT/Radials weak peripheral pulses on LE  Accessory Clinical Findings  CBC  Recent Labs  09/12/16 0315  09/13/16 0355 09/13/16 0846  WBC 41.5*  --  37.9*  --   NEUTROABS  --   --  32.6*  --   HGB 12.9*  < > 14.1 15.3  HCT 35.7*  < > 38.8* 45.0  MCV 94.4  --  93.7  --   PLT 414*  --  328  --   < > = values in this interval not displayed. Basic Metabolic Panel  Recent Labs  09/12/16 0315  09/12/16 2147 09/13/16 0355 09/13/16 0846  NA 129*  < > 133* 134* 137  K 2.3*  < > 4.5 4.6 4.4  CL 98*  < > 112* 112* 110  CO2 17*  < > 15* 16*  --   GLUCOSE 349*  < > 74 94 83  BUN 16  < >  _0 CREATININE 1.00  < > 0.66 0.67 0.60*  CALCIUM 7.7*  < > 7.5* 7.4*  --   MG 1.4*  --   --  2.3  --   PHOS 3.6  --   --   --   --   < > = values in this interval not displayed. Liver Function Tests  Recent Labs  09/12/16 1140 09/13/16 0355  AST 71* 61*  ALT 46 52  ALKPHOS 37* 52  BILITOT 0.4 0.3  PROT 3.5* 4.7*  ALBUMIN 1.4* 2.0*   No results for input(s): LIPASE, AMYLASE in the last 72 hours. Cardiac Enzymes  Recent Labs  09/12/16 0505 09/12/16 1140 09/12/16 1810  TROPONINI 0.82* 0.46* 0.50*   Radiology/Studies  Ct Angio Chest Pe W And/or Wo Contrast  Result Date: 09/12/2016 CLINICAL DATA:  Status post CPR, following cardiac arrest. Initial encounter.  IMPRESSION: 1. No evidence of pulmonary embolus. 2. Patchy bilateral airspace opacification, concerning for multifocal pneumonia or possibly pulmonary edema. 3. Diffuse coronary artery calcification. 4. Vague edema suggested about the gallbladder, raising question for cholecystitis. Would correlate with the patient's symptoms. 5. Scattered aortic atherosclerosis. 6. Fracture through the anterior superior endplate of N27, with  depression of the fragment and surrounding degenerative change. This may reflect a subacute or chronic fracture. Chronic loss of height at T12 and L1. Electronically Signed   By: Garald Balding M.D.   On: 09/12/2016 00:46   Dg Chest Port 1 View  Result Date: 09/13/2016 CLINICAL DATA:  History of cardiac arrest, followup EXAM: PORTABLE CHEST 1 VIEW COMPARISON:  Chest x-ray of 09/12/2016 FINDINGS: The carina is difficult to visualize, but the tip of the endotracheal tube appears to be approximately 2.4 cm above the carina. Left IJ central venous line tip overlies the mid upper SVC. No active infiltrate or effusion is seen, with mild bibasilar linear atelectasis present. Heart size is stable. IMPRESSION: 1. Mild bibasilar linear atelectasis. 2. Endotracheal tube tip appears to be approximately 2.4 cm above the carina. Electronically Signed   By: Ivar Drape M.D.   On: 09/13/2016 08:09   Dg Chest Port 1 View  Result Date: 09/12/2016 CLINICAL DATA:  Central line and orogastric tube placement EXAM: PORTABLE CHEST 1 VIEW COMPARISON:  None. FINDINGS: Left IJ approach central venous catheter tip overlies the lower SVC. The orogastric tube tip and side port overlie the gastric fundus. Endotracheal tube tip is below the level of the clavicles, 4.5 cm above the inferior margin of the carina. Normal cardiomediastinal contours. There is central pulmonary vascular congestion without overt edema. IMPRESSION: 1. Left IJ central venous catheter tip overlying the lower SVC. 2. Orogastric tube tip and side port at the gastric fundus. Electronically Signed   By: Ulyses Jarred M.D.   On: 09/12/2016 03:10   Dg Chest Port 1 View  Result Date: 09/04/2016 CLINICAL DATA:  Cardiac arrest tonight.  Intubation. EXAM: PORTABLE CHEST 1 VIEW COMPARISON:  None. FINDINGS: Endotracheal tube with tip measuring 6.2 cm above the carina. Normal heart size and pulmonary vascularity. Lungs are clear. No pneumothorax. Visualized ribs appear  intact. Calcification of the aorta. IMPRESSION: Endotracheal tube tip measures 6.2 cm above the carina. No evidence of active pulmonary disease. Electronically Signed   By: Lucienne Capers M.D.   On: 09/10/2016 23:47   TELE: SR, 65-80  ECG: SR, low voltage, non-specific CT-T wave abnormalities    ASSESSMENT AND PLAN  [name] is a 70 y.o. male with a  history significant for HTN, dyslipidemia, NIDDM, and non-obstructive CAD who presented to the [facility] ED after an out of hospital cardiac arrest.  The pt's initial presenting rhythm is unclear, but at some point he was noted to be in VT and several shocks were delivered.  ECG on arrival does not demonstrate a STEMI and otherwise is without definitive findings of acute myocardial ischemia.  Urgent coronary angiography was deferred.  # Cardiac Arrest; unclear etiology, cooling protocol per CCM - deferred cath, discussed with Dr. Saunders Revel - minimal troponin elevated most probably sec to VT and CPR - defer systemic anticoagulation and DAPT given no evidence of acute plaque rupture event - pressor support per CCM - TTE still pending - we will decide on a possible cath based on neurologic recovery and TTE findings, per CCM he is having seizures and prognosis is poor, they will discuss with family - nsVTs no longer present, no amiodarone needed  # HTN - hold quinipril and metoprolol succinate given vasopressor requirements  # Dyslipidemia - okay to hold outpatient fenofibrate  # nsVT - replace potassium  Signed, Ena Dawley MD, [facility] 09/13/2016

## 2016-09-13 NOTE — CV Procedure (Signed)
  Electroencephalogram report- LTM    Data acquisition: 10-20 electrode placement.  Additional T1, T2, and EKG electrodes; 26 channel digital referential acquisition reformatted to 18 channel/7 channel coronal bipolar       Beginning time: 09/12/16 at 11 34 12 am  Ending time: 09/13/16 07 22 45 am   Day of study: day 1   This 24 hours of intensive EEG monitoring with simultaneous video monitoring was performed for this patient with cardiac arrest and cerebral anoxia to rule out clinical and subclinical seizures. Medications: Intubated and sedated  There was no pushbutton activations events during this recording.  Throughout the recording background activities were marked by suppressed background activities with superimposed isolated bursts and runs of generalized interior dominant fast polyspike and wave discharges.  When presented in a longer 5-15 seconds runs such generalized polyspike and wave discharges have some evolving features suggestive of electrographic seizures.  Clinical interpretation: This  intensive EEG monitoring with simultaneous video monitoring is abnormal due to background activities suppression with superimposed bursts and short runs of generalized fast spike and wave discharges as discussed above.  We then longer runs of generalized epileptiform discharges sometimes involving features suggestive of electrographic seizures.  This finding consistent with severe encephalopathy and significant cortical irritability most likely due to cerebral anoxia based on  patient history.

## 2016-09-13 NOTE — Progress Notes (Signed)
Pharmacy Antibiotic Note [name] is a 70 y.o. male admitted on 08/31/2016 with cardiac arrest. Pharmacy has been consulted for Unasyn dosing for aspiration pneumonia. Patient was on hypothermia protocol but re-warming has started and should finish around 1500 today. Patient has had seizure activity on this admission, so we will reduce the Unasyn dose due to its risk of seizures. WBC are down to 37.9 today.  Plan: Reduce Unasyn to 1.5 gm Q 6 hours.  Monitor clinical s/sx of infection.  Monitor LOT, seizure activity     Height: 5\' 9"  (175.3 cm) Weight: 150 lb (68 kg) IBW/kg (Calculated) : 70.7  Temp (24hrs), Avg:92.3 F (33.5 C), Min:90.7 F (32.6 C), Max:96.8 F (36 C)   Recent Labs Lab 08/29/2016 2303 08/27/2016 2325  09/12/16 0315  09/12/16 1152 09/12/16 1634 09/12/16 2147 09/13/16 0355 09/13/16 0846  WBC 33.9*  --   --  41.5*  --   --   --   --  37.9*  --   CREATININE 0.84  --   < > 1.00  < > 0.50* 0.40* 0.66 0.67 0.60*  LATICACIDVEN  --  8.86*  --  2.7*  --   --   --   --   --   --   < > = values in this interval not displayed.  Estimated Creatinine Clearance: 83.8 mL/min (by C-G formula based on SCr of 0.6 mg/dL (L)).    Allergies  Allergen Reactions  . Aleve [Naproxen Sodium]     Unknown    Antimicrobials this admission: 11/21 Unasyn >>   Dose adjustments this admission: 11/22- Unasyn 3 gm Q  6 to 1.5 Gm Q 6   Microbiology results: 11/21 BCx:  11/21 Sputum:   11/21 MRSA PCR: Negative   Thank you for allowing pharmacy to be a part of this patient's care.  Bailey MechEmily Stewart, PharmD PGY1 Pharmacy Resident 09/13/2016, 1:03 PM Pager: [phone]

## 2016-09-13 NOTE — Progress Notes (Signed)
Subjective: Paralytic stopped earlier, now with subtle intermittent bilateral shoulder movements.   Exam: Vitals:   09/13/16 1840 09/13/16 1900  BP: 111/60 100/67  Pulse: 83 84  Resp: (!) 30 (!) 30  Temp:  (!) 96.6 F (35.9 C)   Gen: In bed, NAD Resp: non-labored breathing, no acute distress Abd: soft, nt  Neuro: MS: eyes partially open CN: PERRL, corneals intact. Occasional breaths over vent.  Motor: no motor response to noxious stimulation.  Sensory: as above.   He has occasional bilaterally synchronus movements of the shoulders and jaw, consistent with a subtle generalized myoclonus.   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 With the appearance on EEG(initially ictal bursts on flat background), I think that this is enough to say that he has a dismal progonsis. Discussing with the daughter, it sounds like he was having multiple problems even prior to the current event.   I discussed with her that I did not feel aggressive treatment such as medically induced coma would be at all likely to help him and she expressed understanding.   I do not feel at this time that he has any chance at recovery to an independent state.   Propofol has shown to be useful in improving control of myoclonus, but has not been shown to improve outcomes. This could be used if the movements become more problematic, but I think that I would not start this at this time given that he is already on pressors.   Recommendations: 1) Likely will need to discuss with other family prior to deciding about end of life decisions.  2) would not proceed to general anesthesia(e.g. Burst suppression) in this setting with this EEG pattern. 3) continue other AEDs   Ritta SlotMcNeill Jorene Kaylor, MD [facility] [phone]  If 7pm- 7am, please page  neurology on call as listed in AMION.

## 2016-09-13 NOTE — Progress Notes (Signed)
eLink Physician-Brief Progress Note Patient Name: [name] DOB: 10/19/1946 MRN: [id number]   Date of Service  09/13/2016  HPI/Events of Note  persistent hypoglycemia on d5  eICU Interventions  Change to d10 infusion     Intervention Category Evaluation Type: Other  [name] 09/13/2016, 11:45 PM

## 2016-09-13 NOTE — Progress Notes (Addendum)
PULMONARY / CRITICAL CARE MEDICINE   Name: [name] MRN: [id number] DOB: 01/17/1946    ADMISSION DATE:  09/08/2016  CHIEF COMPLAINT:  Cardiac Arrest   HISTORY OF PRESENT ILLNESS:   Patient critically ill. History obtained from family and from chart review.  [name] is 70 y.o. male with PMH of HTN, HLD, T2DM, current EtOH abuse, and PVD with planned stents who presents s/p CPR for cardiac arrest. Family found him down on the ground at home. Had previously been seen 10 minutes prior and was in his usual state. Fire arrived to the scene, started CPR and gave 3 shocks. EMS arrived, patient was in PEA. EMS gave 1 additional shock with continued CPR. Patient was given epi x6 and they had ROSC after total of approximately 15-20 minutes arrested. He was placed on an epi drip for hypotension. A King airway was placed prior to arrival in ED. In the ED, patient's airway was exchanged for ETT. Cardiology evaluated the patient and found no indication for cath lab at time of arrival. CCM was called for admission. Confirmed with family at admission that patient should be DNR status but wish to purse continued intensive care.   SUBJECTIVE:  Of epi On levophed 31 mics rewarm started eeg concerns, now ceeg, has seizure focus  VITAL SIGNS: BP 106/77   Pulse 81   Temp (!) 94.3 F (34.6 C)   Resp (!) 30   Ht 5\' 9"  (1.753 m)   Wt 68 kg (150 lb)   SpO2 100%   BMI 22.15 kg/m   HEMODYNAMICS: CVP:  [5 mmHg-9 mmHg] 9 mmHg  VENTILATOR SETTINGS: Vent Mode: PRVC FiO2 (%):  [30 %-50 %] 30 % Set Rate:  [22 bmp-30 bmp] 30 bmp Vt Set:  [560 mL] 560 mL PEEP:  [5 cmH20] 5 cmH20 Plateau Pressure:  [13 cmH20-16 cmH20] 15 cmH20  INTAKE / OUTPUT: I/O last 3 completed shifts: In: 7978.1 [I.V.:6258.1; IV Piggyback:1720] Out: 1445 [Urine:1045; Emesis/NG output:400]  PHYSICAL EXAMINATION: General:  Frail, thin male unresponsive and paralyzed Neuro:  Paralyzed HEENT:  ETT in place. Abrasions  present over left temple and face.  Cardiovascular:  s1 s2 RRR Lungs:  Distant clear Abdomen:  NTND, soft, no r/g, low BS Musculoskeletal:  Warm ext , poor pushes, both legs are identicle on exam  Skin:  Mottled skin bilaterally on LE.   LABS:  BMET  Recent Labs Lab 09/12/16 1140  09/12/16 2147 09/13/16 0355 09/13/16 0846  NA 138  < > 133* 134* 137  K 2.3*  < > 4.5 4.6 4.4  CL 118*  < > 112* 112* 110  CO2 14*  --  15* 16*  --   BUN 13  < > 16 14 14   CREATININE 0.57*  < > 0.66 0.67 0.60*  GLUCOSE 189*  < > 74 94 83  < > = values in this interval not displayed.  Electrolytes  Recent Labs Lab 09/12/16 0315  09/12/16 1140 09/12/16 2147 09/13/16 0355  CALCIUM 7.7*  < > 5.4* 7.5* 7.4*  MG 1.4*  --   --   --  2.3  PHOS 3.6  --   --   --   --   < > = values in this interval not displayed.  CBC  Recent Labs Lab 08/27/2016 2303  09/12/16 0315  09/12/16 1634 09/13/16 0355 09/13/16 0846  WBC 33.9*  --  41.5*  --   --  37.9*  --   HGB 13.8  < >  12.9*  < > 14.3 14.1 15.3  HCT 39.4  < > 35.7*  < > 42.0 38.8* 45.0  PLT 352  --  414*  --   --  328  --   < > = values in this interval not displayed.  Coag's  Recent Labs Lab 08-01-16 2303 09/12/16 0315 09/12/16 0741  APTT 42* 37* 39*  INR 1.69 1.85 2.08    Sepsis Markers  Recent Labs Lab 08-01-16 2325 09/12/16 0315  LATICACIDVEN 8.86* 2.7*    ABG  Recent Labs Lab 09/12/16 1143 09/12/16 1510 09/13/16 0445  PHART 7.254* 7.319* 7.335*  PCO2ART 30.9* 28.8* 28.2*  PO2ART 187.0* 112.0* 162*    Liver Enzymes  Recent Labs Lab 09/12/16 1140 09/13/16 0355  AST 71* 61*  ALT 46 52  ALKPHOS 37* 52  BILITOT 0.4 0.3  ALBUMIN 1.4* 2.0*    Cardiac Enzymes  Recent Labs Lab 09/12/16 0505 09/12/16 1140 09/12/16 1810  TROPONINI 0.82* 0.46* 0.50*    Glucose  Recent Labs Lab 09/13/16 0108 09/13/16 0208 09/13/16 0304 09/13/16 0355 09/13/16 0607 09/13/16 0844  GLUCAP 88 75 77 94 84 75     Imaging Dg Chest Port 1 View  Result Date: 09/13/2016 CLINICAL DATA:  History of cardiac arrest, followup EXAM: PORTABLE CHEST 1 VIEW COMPARISON:  Chest x-ray of 09/12/2016 FINDINGS: The carina is difficult to visualize, but the tip of the endotracheal tube appears to be approximately 2.4 cm above the carina. Left IJ central venous line tip overlies the mid upper SVC. No active infiltrate or effusion is seen, with mild bibasilar linear atelectasis present. Heart size is stable. IMPRESSION: 1. Mild bibasilar linear atelectasis. 2. Endotracheal tube tip appears to be approximately 2.4 cm above the carina. Electronically Signed   By: Dwyane DeePaul  Barry M.D.   On: 09/13/2016 08:09     STUDIES:  CXR 11/17: New compression deformity involving lower thoracic vertebral body consistent with fracture. Left basilar nodular density is noted concerning for possible neoplasm.  CULTURES: Tracheal Culture 11/21 >>>   ANTIBIOTICS: Unasyn 11/20>>>  SIGNIFICANT EVENTS: 11/20: Presented s/p cardiac arrest. Intubated.   LINES/TUBES: ETT 11/20 >>  11/20 left IJ>>>  DISCUSSION: [name] is 70 y.o. male with non-obstructive CAD, EtOH abuse, and HTN who presented after resuscitation from cardiac arrest outside of hospital. Patient is a previous smoker but has no known lung history. New questionable pulmonary nodule seen on CXR from three days prior to admission.   ASSESSMENT / PLAN:  PULMONARY A: ARF Concern for pulmonary nodule on CXR 11/17  Asp PNA Slight edema likely  P:   ABg reviewed, keep same MV, when we rewarm , may need increase MV?  ABG at rewarm pcxr in am , limit such pos balance  CARDIOVASCULAR A:  H/o HTN  H/o Non-obstructive CAD (25% stenosis of LAD on cath April 2017) H/o PVD  S/p cardiac arrest in field  Prolonged Qtc  Rel AI P:   ASA  Levophed to map goal 60 once rewarm Add stress steroids Echo today Bad PVD, feet equal, not a picture of ischemia limb  RENAL A:    Metabolic acidosis  Hypok, hypo mag P:   Gross pos balance kvo Chem in am  GASTROINTESTINAL A:   At risk shock liver P:   IV Pepcid  Tf after rewarm LF tin am further  HEMATOLOGIC A:   DVT prophylaxis  P:  Cbc in am  Heparin SQ  SCDs   INFECTIOUS A:   Concern  for APS PNA- left P:   Maintain unasyn, consider dose adjustement with seziures concnerns   ENDOCRINE A:   H/o T2DM  hyperglyemia resolved Borderline low P:   Monitor CBGs - ICU protocol, now off drip, lantus, started Stress roids Reduce lantus  NEUROLOGIC A:   Acute encephalopathy  Paralytic and sedation for cooling protocol  Concern seziure focus anoxia P:   Nimbex  Fentanyl gtt and prn  Versed gtt and prn  EEG now RASS goal: -5 Anti seizure meds per neuro, appreciate them c EEG Concern poor outcome, wil need update in am after rewarm exam   FAMILY  - Updates: sister updated   - Inter-disciplinary family meet or Palliative Care meeting due by: 11/28   Ccm time 35 min   Joshua Rossetti. [name] Alias, MD, FACP Pgr: [phone] [facility]

## 2016-09-14 ENCOUNTER — Inpatient Hospital Stay (HOSPITAL_COMMUNITY): Payer: Medicare HMO

## 2016-09-14 DIAGNOSIS — G931 Anoxic brain damage, not elsewhere classified: Secondary | ICD-10-CM

## 2016-09-14 LAB — COMPREHENSIVE METABOLIC PANEL
ALBUMIN: 1.7 g/dL — AB (ref 3.5–5.0)
ALT: 40 U/L (ref 17–63)
ANION GAP: 7 (ref 5–15)
AST: 52 U/L — ABNORMAL HIGH (ref 15–41)
Alkaline Phosphatase: 49 U/L (ref 38–126)
BILIRUBIN TOTAL: 0.2 mg/dL — AB (ref 0.3–1.2)
BUN: 14 mg/dL (ref 6–20)
CO2: 15 mmol/L — ABNORMAL LOW (ref 22–32)
Calcium: 7.3 mg/dL — ABNORMAL LOW (ref 8.9–10.3)
Chloride: 110 mmol/L (ref 101–111)
Creatinine, Ser: 0.91 mg/dL (ref 0.61–1.24)
GFR calc Af Amer: >60 mL/min (ref >60)
GFR calc non Af Amer: >60 mL/min (ref >60)
GLUCOSE: 120 mg/dL — AB (ref 65–99)
POTASSIUM: 4.9 mmol/L (ref 3.5–5.1)
Sodium: 132 mmol/L — ABNORMAL LOW (ref 135–145)
TOTAL PROTEIN: 4.8 g/dL — AB (ref 6.5–8.1)

## 2016-09-14 LAB — CBC WITH DIFFERENTIAL/PLATELET
BASOS ABS: 0 10*3/uL (ref 0.0–0.1)
Basophils Relative: 0 %
EOS ABS: 0 10*3/uL (ref 0.0–0.7)
Eosinophils Relative: 0 %
HEMATOCRIT: 37.2 % — AB (ref 39.0–52.0)
Hemoglobin: 13.5 g/dL (ref 13.0–17.0)
LYMPHS ABS: 1.3 10*3/uL (ref 0.7–4.0)
Lymphocytes Relative: 3 %
MCH: 34.1 pg — ABNORMAL HIGH (ref 26.0–34.0)
MCHC: 36.3 g/dL — AB (ref 30.0–36.0)
MCV: 93.9 fL (ref 78.0–100.0)
MONOS PCT: 5 %
Monocytes Absolute: 2.1 10*3/uL — ABNORMAL HIGH (ref 0.1–1.0)
NEUTROS ABS: 38.8 10*3/uL — AB (ref 1.7–7.7)
Neutrophils Relative %: 92 %
Platelets: 275 10*3/uL (ref 150–400)
RBC: 3.96 MIL/uL — ABNORMAL LOW (ref 4.22–5.81)
RDW: 13.7 % (ref 11.5–15.5)
WBC: 42.2 10*3/uL — ABNORMAL HIGH (ref 4.0–10.5)

## 2016-09-14 LAB — POCT I-STAT 3, ART BLOOD GAS (G3+)
Acid-base deficit: 10 mmol/L — ABNORMAL HIGH (ref 0.0–2.0)
Bicarbonate: 14.7 mmol/L — ABNORMAL LOW (ref 20.0–28.0)
O2 Saturation: 94 %
PCO2 ART: 28.2 mmHg — AB (ref 32.0–48.0)
PH ART: 7.324 — AB (ref 7.350–7.450)
TCO2: 16 mmol/L (ref 0–100)
pO2, Arterial: 76 mmHg — ABNORMAL LOW (ref 83.0–108.0)

## 2016-09-14 LAB — GLUCOSE, CAPILLARY
GLUCOSE-CAPILLARY: 82 mg/dL (ref 65–99)
GLUCOSE-CAPILLARY: 113 mg/dL — AB (ref 65–99)

## 2016-09-14 MED ORDER — MORPHINE BOLUS VIA INFUSION
5.0000 mg | INTRAVENOUS | Status: DC | PRN
  Administered 2016-09-14: 20 mg via INTRAVENOUS
  Administered 2016-09-14 (×2): 10 mg via INTRAVENOUS
  Filled 2016-09-14: qty 20

## 2016-09-14 MED ORDER — INSULIN ASPART 100 UNIT/ML ~~LOC~~ SOLN: 0.0000 [IU] | SUBCUTANEOUS | Status: DC

## 2016-09-14 MED ORDER — ATROPINE SULFATE 1 % OP SOLN
2.0000 [drp] | Freq: Four times a day (QID) | OPHTHALMIC | Status: DC | PRN
  Administered 2016-09-14: 2 [drp] via SUBLINGUAL
  Filled 2016-09-14: qty 2

## 2016-09-14 MED ORDER — SODIUM CHLORIDE 0.9 % IV SOLN
10.0000 mg/h | INTRAVENOUS | Status: DC
  Administered 2016-09-14: 45 mg/h via INTRAVENOUS
  Administered 2016-09-14: 10 mg/h via INTRAVENOUS
  Filled 2016-09-14 (×2): qty 10

## 2016-09-15 ENCOUNTER — Encounter: Payer: Self-pay | Admitting: Internal Medicine

## 2016-09-18 ENCOUNTER — Telehealth: Payer: Self-pay

## 2016-09-18 NOTE — Telephone Encounter (Signed)
On 09/18/2016 I received a death certificate from [facility] (original). The death certificate is for burial. The patient is a patient of [facility] [employer] [facility]. [facility].  On 09/19/2016 I received the death certificate back from [facility]. I got the death certificate ready and called the funeral home to let them know the [facility].

## 2016-09-22 NOTE — Plan of Care (Signed)
  Interdisciplinary Goals of Care Family Meeting   Date carried out:: 08/23/2016  Location of the meeting: Bedside  Member's involved: Physician, Bedside Registered Nurse and Family Member or next of kin  Durable Power of Attorney or acting medical decision maker: Daughter.    Discussion: We discussed goals of care for [name]. I spoke with the patient's daughter his HCPOA and Sister at bedside. We discussed his dismal prognosis and respiratory failure. Family wish to proceed with full comfort care. We discussed terminal ventilator wean and extubation. We will using a morphine infusion for comfort. 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 We will have the EEG tech remove the patient's leads.  Code status: Full DNR  Disposition: In-patient comfort care  Time spent for the meeting: 9 minutes  [name] 09/13/2016, 9:25 AM

## 2016-09-22 NOTE — Discharge Summary (Signed)
DEATH NOTE: For a complete accounting of the patient's history and physical exam on presentation please refer to the H&P on 02-Mar-2016. In brief, the patient was a 70 year old male with history of hypertension, hyperlipidemia, and type 2 diabetes mellitus with current and ongoing alcohol use as well as intermittent tobacco use. Patient was found down at home by family after being seen normal 10 minutes prior. At the time of arrival of fire department staff CPR was started and 3 shocks were administered. Upon EMS arrival patient was reportedly in pulseless electrical activity. Patient was given 1 additional shock and continued CPR. After a total of approximately 15-20 minutes there were able to regain spontaneous circulation. Patient was ultimately intubated in the emergency department with an endotracheal tube and admitted to the ACU with therapeutic hypothermia. Patient remained persistently hypoglycemic requiring dextrose 10 infusion. He was treated with antibiotics for aspiration pneumonia versus pneumonitis empirically. With shock and evidence of anoxic brain injury after rewarming I had a family discussion with the patient's daughter and sister at bedside. Both agree that the patient would not wish to be maintained in his current state and desired to transition to full comfort care. Comfort care order set was placed and patient was terminally extubated at 11:37 AM on 08/26/2016. Patient was pronounced dead at 2:45 PM on 08/29/2016 by nursing staff with family at bedside. Hospital chaplain was available for emotional support.  DIAGNOSES AT DEATH: 1. Acute Hypoxic Respiratory Failure 2. Anoxic Brain Injury 3. Aspiration Pneumonia 4. Cardiogenic Shock 5. Chronic Alcohol Abuse 6. Tobacco Use Disorder 7. History of dyslipidemia 8. History of hypertension 9. History of diabetes mellitus type 2 10. History of nonobstructive coronary artery disease 11. History of peripheral vascular disease

## 2016-09-22 NOTE — Progress Notes (Signed)
Sedation and pain drips wasted: Fentanyl 225ml witnessed waste in sink Versed 40 ml witnessed waste in sink, witnessed by Brunswick CorporationKatelyn RN

## 2016-09-22 NOTE — Progress Notes (Signed)
TOD 1445, pronounced with Prince RomeJessica Milford, RN. Family at bedside. Emotional support provided and pastor with family.

## 2016-09-22 NOTE — Progress Notes (Signed)
RT terminally extubated to room air per MD order. No complications. Patient tolerated well. RN and family at bedside. Rt will continue to monitor.

## 2016-09-22 NOTE — Procedures (Addendum)
Electroencephalogram report- LTM    Data acquisition: 10-20 electrode placement.  Additional T1, T2, and EKG electrodes; 26 channel digital referential acquisition reformatted to 18 channel/7 channel coronal bipolar      Beginning time: 09/12/16 at 11 34 12 am  Ending time: 09/13/2016 07 22 45 am   Day of study: day 1 , day 2   This intensive EEG monitoring with simultaneous video monitoring was performed for this patient with cardiac arrest and cerebral anoxia to rule out clinical and subclinical seizures. Medications: Intubated and sedated  Day 1:  There was no pushbutton activations events during this recording.  Throughout the recording background activities were marked by suppressed background activities with superimposed isolated bursts and runs of generalized interior dominant fast polyspike and wave discharges.  When presented in a longer 5-15 seconds runs such generalized polyspike and wave discharges have some evolving features suggestive of electrographic seizures.   Day 2: No pushbutton activations events during this recording.  During first half of the recording background activity is marked by 1-3 cps bursts of irregular interior dominant high amplitude generalized fast spike and polyspike and wave discharges alternating with 1-2 seconds of suppressed background activities.  As recording progresses, generalized 2 cps spike and wave discharges  become more continuous with superimposed 2-4 cps bursts of fast mildly  evolving  generalized anteriorly dominant spike and wave discharges.  There was no obvious clinical accompaniment to these findings.   Clinical interpretation: This  intensive EEG monitoring with simultaneous video monitoring is abnormal due to suppressed background activities with superimposed continuous 1-2 cps generalized epileptiform discharges and occasionally brief electrographic seizures. These findings are  suggestive of severe cortical irritability, and in setting  of cerebral anoxia typically suggestive of a poor prognosis .   Clinical correlation is advised

## 2016-09-22 NOTE — Progress Notes (Addendum)
PULMONARY / CRITICAL CARE MEDICINE   Name: Joshua PostinHoward Fitzsimmons MRN: [id number] DOB: 10/20/1946    ADMISSION DATE:  09/01/2016  CHIEF COMPLAINT:  Cardiac Arrest   HISTORY OF PRESENT ILLNESS:   Patient critically ill. History obtained from family and from chart review.  [name] is 70 y.o. male with PMH of HTN, HLD, T2DM, current EtOH abuse, and PVD with planned stents who presents s/p CPR for cardiac arrest. Family found him down on the ground at home. Had previously been seen 10 minutes prior and was in his usual state. Fire arrived to the scene, started CPR and gave 3 shocks. EMS arrived, patient was in PEA. EMS gave 1 additional shock with continued CPR. Patient was given epi x6 and they had ROSC after total of approximately 15-20 minutes arrested. He was placed on an epi drip for hypotension. A King airway was placed prior to arrival in ED. In the ED, patient's airway was exchanged for ETT. Cardiology evaluated the patient and found no indication for cath lab at time of arrival. CCM was called for admission. Confirmed with family at admission that patient should be DNR status but wish to purse continued intensive care.   SUBJECTIVE:  No acute events overnight. Patient did have persistent hypoglycemia despite D5 infusion & was switched to D10 infusion. Neurology assessed patient and did not feel medically induced coma would be beneficial to his prognosis.   REVIEW OF SYSTEMS:  Unable to obtain given intubation and altered mental status.   VITAL SIGNS: BP 109/63   Pulse 84   Temp 98.6 F (37 C) (Core (Comment))   Resp (!) 30   Ht 5\' 9"  (1.753 m)   Wt 150 lb (68 kg)   SpO2 100%   BMI 22.15 kg/m   HEMODYNAMICS: CVP:  [9 mmHg] 9 mmHg  VENTILATOR SETTINGS: Vent Mode: PRVC FiO2 (%):  [30 %] 30 % Set Rate:  [30 bmp] 30 bmp Vt Set:  [560 mL] 560 mL PEEP:  [5 cmH20] 5 cmH20 Plateau Pressure:  [15 cmH20-18 cmH20] 17 cmH20  INTAKE / OUTPUT: I/O last 3 completed shifts: In: 5463.8  [I.V.:4263.8; IV Piggyback:1200] Out: 1125 [Urine:725; Emesis/NG output:400]  PHYSICAL EXAMINATION: General:  Thin male. No distress. No family at bedside.  Neuro:  No withdrawal to pain. Intermittent blinking. No Babinski.  HEENT:  ETT in place. No scleral icterus. Moist mucus membranes. Cardiovascular:  Regular rate. No edema or JVD. Lungs:  Clear with auscultation. Symmetric chest rise on ventilator. Abdomen:  Soft. Nondistended. Hypoactive bowel sounds. Integument:  Mottled. Cool and dry to the touch.   LABS:  BMET  Recent Labs Lab 09/12/16 2147 09/13/16 0355 09/13/16 0846 09/13/16 1603 02-13-16 0500  NA 133* 134* 137 135 132*  K 4.5 4.6 4.4 5.2* 4.9  CL 112* 112* 110 108 110  CO2 15* 16*  --   --  15*  BUN 16 14 14 16 14   CREATININE 0.66 0.67 0.60* 0.70 0.91  GLUCOSE 74 94 83 85 120*    Electrolytes  Recent Labs Lab 09/12/16 0315  09/12/16 2147 09/13/16 0355 02-13-16 0500  CALCIUM 7.7*  < > 7.5* 7.4* 7.3*  MG 1.4*  --   --  2.3  --   PHOS 3.6  --   --   --   --   < > = values in this interval not displayed.  CBC  Recent Labs Lab 09/12/16 0315  09/13/16 0355 09/13/16 0846 09/13/16 1603 02-13-16 0500  WBC 41.5*  --  37.9*  --   --  42.2*  HGB 12.9*  < > 14.1 15.3 15.6 13.5  HCT 35.7*  < > 38.8* 45.0 46.0 37.2*  PLT 414*  --  328  --   --  275  < > = values in this interval not displayed.  Coag's  Recent Labs Lab 10-08-16 2303 09/12/16 0315 09/12/16 0741  APTT 42* 37* 39*  INR 1.69 1.85 2.08    Sepsis Markers  Recent Labs Lab 08-Oct-2016 2325 09/12/16 0315  LATICACIDVEN 8.86* 2.7*    ABG  Recent Labs Lab 09/13/16 0445 09/13/16 1631 08/25/2016 0515  PHART 7.335* 7.261* 7.324*  PCO2ART 28.2* 31.2* 28.2*  PO2ART 162* 103.0 76.0*    Liver Enzymes  Recent Labs Lab 09/12/16 1140 09/13/16 0355 08/31/2016 0500  AST 71* 61* 52*  ALT 46 52 40  ALKPHOS 37* 52 49  BILITOT 0.4 0.3 0.2*  ALBUMIN 1.4* 2.0* 1.7*    Cardiac  Enzymes  Recent Labs Lab 09/12/16 0505 09/12/16 1140 09/12/16 1810  TROPONINI 0.82* 0.46* 0.50*    Glucose  Recent Labs Lab 09/13/16 0844 09/13/16 1229 09/13/16 2020 09/13/16 2117 09/13/16 2333 09/13/16 2352  GLUCAP 75 72 51* 82 55* 109*    Imaging Dg Chest Port 1 View  Result Date: 09/11/2016 CLINICAL DATA:  Cardiac arrest.  Shortness of breath. EXAM: PORTABLE CHEST 1 VIEW COMPARISON:  One-view chest x-ray 09/13/2016. FINDINGS: Endotracheal tube is stable. NG tube terminates in the stomach. Left IJ line is stable. Defibrillator pads remain in place. Mild bibasilar airspace disease likely reflects atelectasis. The lungs are otherwise clear. IMPRESSION: 1. Support apparatus is stable. 2. Mild bibasilar atelectasis without other airspace disease. Electronically Signed   By: Marin Roberts M.D.   On: 08/30/2016 07:07     STUDIES:  CXR 11/17: New compression deformity involving lower thoracic vertebral body consistent with fracture. Left basilar nodular density is noted concerning for possible neoplasm. TTE 11/22:  LV normal in size w/ mold to moderate LVH. EF 30-35%. Akinesis of basal-midanteroseptal myocardium. Grade 2 diastolic dysfunction. RV normal in size with mild reduction in systolic function.  MICROBIOLOGY: MRSA PCR 11/21:  Negative    ANTIBIOTICS: Unasyn 11/20>>>  SIGNIFICANT EVENTS: 11/20 - Presented s/p cardiac arrest. Intubated.   LINES/TUBES: OETT 7.5 11/20 >>  L IJ CVL 11/20  >> L Fem Art Line 11/21 >> OGT 11/20 >> Foley 11/20 >> PIV  ASSESSMENT / PLAN:  PULMONARY A: Acute Hypoxic Respiratory Failure Possible Pulmonary Nodule - Seen on CXR 11/17. Aspiration Pneumonia  P:   Full Vent Support Likely will be terminal vent wean & extubation  CARDIOVASCULAR A:  S/P Cardiac Arrest - S/P Therapeutic Hypothermia.  Cardiogenic Shock Prolonged QTc H/O HTN  H/O Non-obstructive CAD (25% stenosis of LAD on cath April 2017) H/O PVD  H/O  Hyperlipidemia  P:   Continuous Telemetry Monitoring Vitals per unit protocol ASA 81mg  daily Levophed gtt Cardiology Consulted & Following - Appreciate recommendations.  NEUROLOGIC A:   Acute Encephalopathy - Multifactorial from seizure and anoxic brain injury. S/P Therapeutic Hypothermia Anoxic Brain Injury EtOH Use - Current.  P:   Desired RASS:  0 to -1 (off cooling) Fentanyl IV prn Neurology Consulted & Following - Appreciate assessment & recommendations AEDs:  Vimpat, Keppra, Depacon Continuous EEG  RENAL A:   NAGMA Hypokalemia - Resolved. Hypomagnesemia - Resolved. Hyponatremia - Mild.   P:   Monitoring UOP with Foley Trending renal function & electrolytes daily Replacing electrolytes as indicated  GASTROINTESTINAL A:   At risk shock liver  P:   Pepcid IV q12hr Tf after rewarm LF tin am further  HEMATOLOGIC A:   Leukocytosis - Multifactorial.  P:  Trending cell counts w/ CBC Heparin Lowry q8hr SCDs   INFECTIOUS A:   Aspiration Pneumonia vs Pneumonitis  P:   Empiric Unasyn Day #3  ENDOCRINE A:   Hypoglycemia - Improved on D10. Did not improve on D5. Possible Adrenal Insufficiency - Cortisol 10.4.  P:   Solu-Cortef 50mg  IV q6hr D10 @ 50cc/hr D/C Lantus  Continue Accu-Checks q4hr Changing to SSI per Sensitive Algorithm   FAMILY  - Updates: No family at bedside 11/23.   - Inter-disciplinary family meet or Palliative Care meeting due by: 11/28   TODAY'S SUMMARY:  70 y.o. male with non-obstructive CAD, EtOH abuse, and HTN who presented after resuscitation from cardiac arrest outside of hospital. Patient now with anoxic brain injury post therapeutic hypothermia. Prognosis is grimm. Plan for family discussion regarding goals of care once they arrive.   I have spent a total of 36 minutes of critical care time today caring for the patient and reviewing the patient's electronic medical record.   [name], M.D. [facility] Pager:  [phone] After 3pm or if no response, call [phone] 7:37 AM 09/21/2016

## 2016-09-22 NOTE — Progress Notes (Signed)
240ml of morphine wasted in sink with Arlice ColtKatira Alexander.

## 2016-09-22 NOTE — Progress Notes (Signed)
Patient Name: [name] Date of Encounter: Sep 22, 2016  Primary Cardiologist: Liane Comber (new)  Hospital Problem List     Active Problems:   Cardiac arrest [facility])   Cardiogenic shock ([place])   Acute respiratory failure with hypoxia ([place])   Encounter for central line placement   Anoxic brain injury ([place])     Subjective   On vent, wife in room. Obtunded  Inpatient Medications    Scheduled Meds: . ampicillin-sulbactam (UNASYN) IV  1.5 g Intravenous Q6H  . aspirin  81 mg Oral Daily  . chlorhexidine gluconate (MEDLINE KIT)  15 mL Mouth Rinse BID  . famotidine (PEPCID) IV  20 mg Intravenous Q12H  . heparin  5,000 Units Subcutaneous Q8H  . hydrocortisone sod succinate (SOLU-CORTEF) inj  50 mg Intravenous Q6H  . insulin aspart  0-9 Units Subcutaneous Q4H  . lacosamide (VIMPAT) IV  200 mg Intravenous Q12H  . levETIRAcetam  1,000 mg Intravenous Q12H  . mouth rinse  15 mL Mouth Rinse 10 times per day  . sodium chloride flush  10-40 mL Intracatheter Q12H  . valproate sodium  500 mg Intravenous Q8H   Continuous Infusions: . dextrose 50 mL/hr at 09/13/16 2347  . epinephrine 4 mcg/min (09/12/16 1200)  . norepinephrine (LEVOPHED) Adult infusion 8 mcg/min (2016-09-22 0700)   PRN Meds: fentaNYL (SUBLIMAZE) injection, sodium chloride flush   Vital Signs    Vitals:   09-22-16 0630 22-Sep-2016 0645 2016/09/22 0700 09/22/2016 0759  BP:   109/63 127/65  Pulse: 87 85 84 88  Resp: (!) 30 (!) 30 (!) 30 (!) 30  Temp:      TempSrc:      SpO2: 100% 98% 100%   Weight:      Height:        Intake/Output Summary (Last 24 hours) at 09-22-2016 0842 Last data filed at Sep 22, 2016 0700  Gross per 24 hour  Intake          2869.07 ml  Output              600 ml  Net          2269.07 ml   Filed Weights   08/23/2016 2314  Weight: 150 lb (68 kg)    Physical Exam    GEN: obtunded HEENT: Grossly normal.  Neck: Supple, no JVD, carotid bruits, or masses. Cardiac: RRR, no murmurs, rubs, or gallops.  No clubbing, cyanosis, edema.  Radials/DP/PT 2+ and equal bilaterally.  Respiratory:  Respirations regular and unlabored, clear to auscultation bilaterally. GI: Soft, nontender, nondistended, BS + x 4. MS: no deformity or atrophy. Skin: warm and dry, no rash. Neuro:  obtunded. Psych: obtunded  Labs    CBC  Recent Labs  09/13/16 0355  09/13/16 1603 2016/09/22 0500  WBC 37.9*  --   --  42.2*  NEUTROABS 32.6*  --   --  38.8*  HGB 14.1  < > 15.6 13.5  HCT 38.8*  < > 46.0 37.2*  MCV 93.7  --   --  93.9  PLT 328  --   --  275  < > = values in this interval not displayed. Basic Metabolic Panel  Recent Labs  09/12/16 0315  09/13/16 0355  09/13/16 1603 22-Sep-2016 0500  NA 129*  < > 134*  < > 135 132*  K 2.3*  < > 4.6  < > 5.2* 4.9  CL 98*  < > 112*  < > 108 110  CO2 17*  < > 16*  --   --  15*  GLUCOSE 349*  < > 94  < > 85 120*  BUN 16  < > 14  < > 16 14  CREATININE 1.00  < > 0.67  < > 0.70 0.91  CALCIUM 7.7*  < > 7.4*  --   --  7.3*  MG 1.4*  --  2.3  --   --   --   PHOS 3.6  --   --   --   --   --   < > = values in this interval not displayed. Liver Function Tests  Recent Labs  09/13/16 0355 October 14, 2016 0500  AST 61* 52*  ALT 52 40  ALKPHOS 52 49  BILITOT 0.3 0.2*  PROT 4.7* 4.8*  ALBUMIN 2.0* 1.7*   No results for input(s): LIPASE, AMYLASE in the last 72 hours. Cardiac Enzymes  Recent Labs  09/12/16 0505 09/12/16 1140 09/12/16 1810  TROPONINI 0.82* 0.46* 0.50*     Telemetry    NSR - Personally Reviewed  ECG     09/12/2016 NSR, NSSTW changes- Personally Reviewed  Radiology    Dg Chest Port 1 View  Result Date: 10/14/16 CLINICAL DATA:  Cardiac arrest.  Shortness of breath. EXAM: PORTABLE CHEST 1 VIEW COMPARISON:  One-view chest x-ray 09/13/2016. FINDINGS: Endotracheal tube is stable. NG tube terminates in the stomach. Left IJ line is stable. Defibrillator pads remain in place. Mild bibasilar airspace disease likely reflects atelectasis. The lungs are  otherwise clear. IMPRESSION: 1. Support apparatus is stable. 2. Mild bibasilar atelectasis without other airspace disease. Electronically Signed   By: San Morelle M.D.   On: October 14, 2016 07:07   Dg Chest Port 1 View  Result Date: 09/13/2016 CLINICAL DATA:  History of cardiac arrest, followup EXAM: PORTABLE CHEST 1 VIEW COMPARISON:  Chest x-ray of 09/12/2016 FINDINGS: The carina is difficult to visualize, but the tip of the endotracheal tube appears to be approximately 2.4 cm above the carina. Left IJ central venous line tip overlies the mid upper SVC. No active infiltrate or effusion is seen, with mild bibasilar linear atelectasis present. Heart size is stable. IMPRESSION: 1. Mild bibasilar linear atelectasis. 2. Endotracheal tube tip appears to be approximately 2.4 cm above the carina. Electronically Signed   By: Ivar Drape M.D.   On: 09/13/2016 08:09    Cardiac Studies   ECHO: 09/13/16 - Left ventricle: The cavity size was normal. Wall thickness was   increased increased in a pattern of mild to moderate LVH. There   was focal basal hypertrophy. Systolic function was moderately to   severely reduced. The estimated ejection fraction was in the   range of 30% to 35%. Akinesis of the basal-midanteroseptal   myocardium. Features are consistent with a pseudonormal left   ventricular filling pattern, with concomitant abnormal relaxation   and increased filling pressure (grade 2 diastolic dysfunction). - Right ventricle: Systolic function was mildly reduced.  Impressions:  - This echo is technically difficult and is poor quality .   With definity contrast , there appears to be moderate LV   dysfunction with akinesis of the mid and basal ant. wall.  Patient Profile     [name] a 70 y.o. malewith a history significant for HTN, dyslipidemia, NIDDM, and non-obstructive CAD who presented to [facility] ED after an out of hospital cardiac arrest. The pt's initial presenting  rhythm is unclear, but at some point he was noted to be in VT and several shocks were delivered. ECG on arrival does  not demonstrate a STEMI and otherwise is without definitive findings of acute myocardial ischemia. Urgent coronary angiography was deferred.  Assessment & Plan    Cardiac Arrest; unclear etiology, cooling protocol per CCM - deferred cath, discussed with Dr. Saunders Revel previously (NO ST elevation) - minimal troponin elevated most probably sec to VT and CPR (TYPE 3 MI)-anoxic brain injury.  - defer systemic anticoagulation and DAPT given no evidence of acute plaque rupture event - pressor support per CCM - TTE EF 30-35% - we will decide on a possible cath based on neurologic recovery, per CCM he is having seizures and prognosis is poor, they will discuss with family - likely withdrawal of life support today - nsVTs no longer present, no amiodarone needed  # HTN - hold quinipril and metoprolol succinate given vasopressor requirements  # Dyslipidemia - okay to hold outpatient fenofibrate  # nsVT - replace potassium  Signed, Candee Furbish, MD  10-02-2016, 8:42 AM

## 2016-09-22 NOTE — Progress Notes (Signed)
D/C LTM EEG

## 2016-09-22 DEATH — deceased

## 2016-10-02 ENCOUNTER — Encounter (HOSPITAL_COMMUNITY): Payer: Medicare HMO

## 2016-10-02 ENCOUNTER — Encounter: Payer: Medicare HMO | Admitting: Surgery

## 2016-10-18 ENCOUNTER — Other Ambulatory Visit: Payer: Self-pay | Admitting: Internal Medicine

## 2018-07-07 IMAGING — MR MR LUMBAR SPINE W/O CM
4 of 5 series · 18 of 48 positions shown · non-contrast
Comparison: 01/28/2015

CLINICAL DATA: Low back pain.  Gait instability.  Right foot drop.

EXAM:
MRI LUMBAR SPINE WITHOUT CONTRAST
TECHNIQUE: Multiplanar, multisequence MR imaging of the lumbar spine was
performed. No intravenous contrast was administered.

[Series 6: T2 · sagittal · 4.0mm · 0.73mm/px · 6 of 20 slices shown (1 of 2)]
[im 1/20]
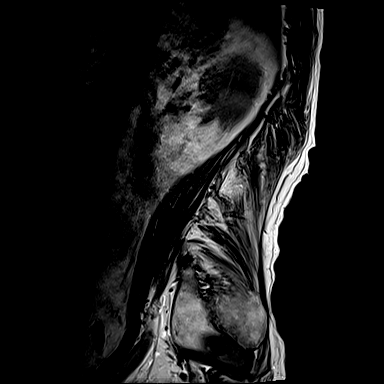
[im 4/20]
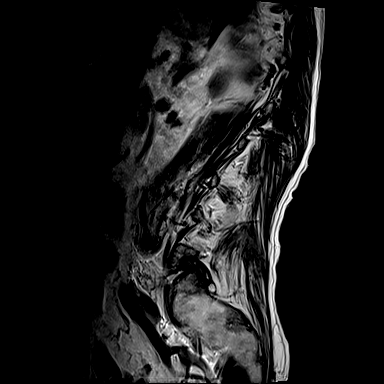
[im 8/20]
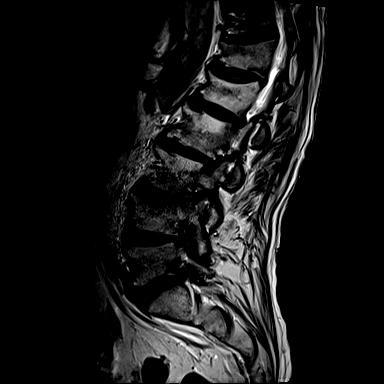
[im 12/20]
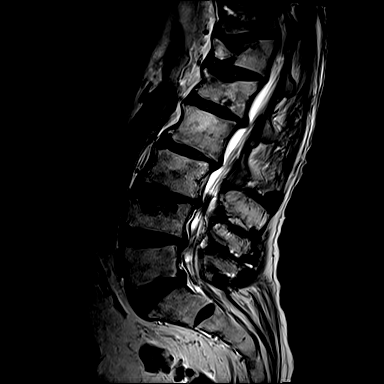
[im 16/20]
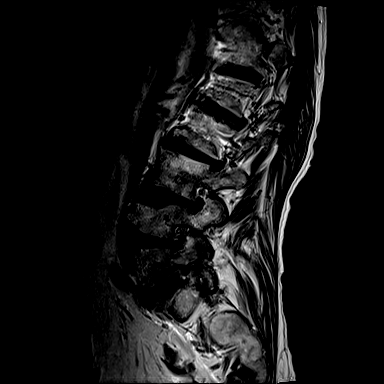
[im 20/20]
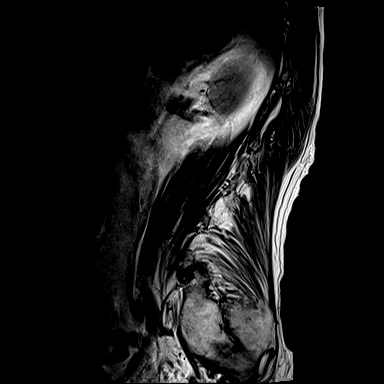

[Series 7: T1 · sagittal · 4.0mm · 0.73mm/px · 3 of 20 slices shown (1 of 2)]
[im 4/20]
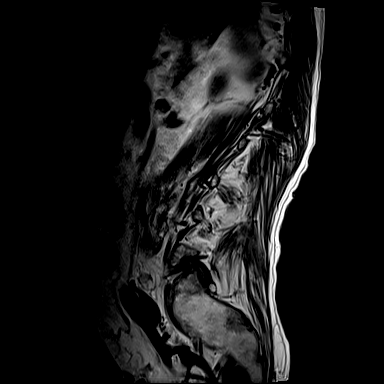
[im 12/20]
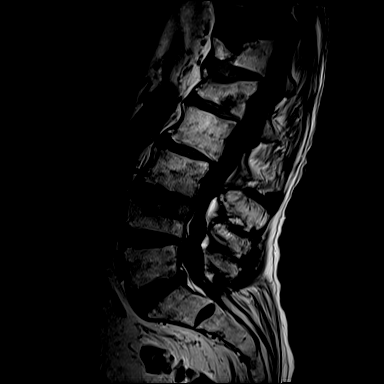
[im 20/20]
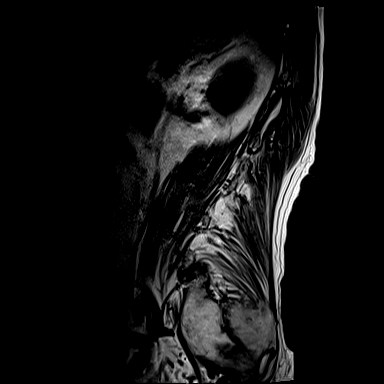

[Series 9: T1 · axial · 4.0mm · 0.28mm/px · z∈[+7,+185]mm · 3 of 50 slices shown (2 of 2)]
[im 8/50]
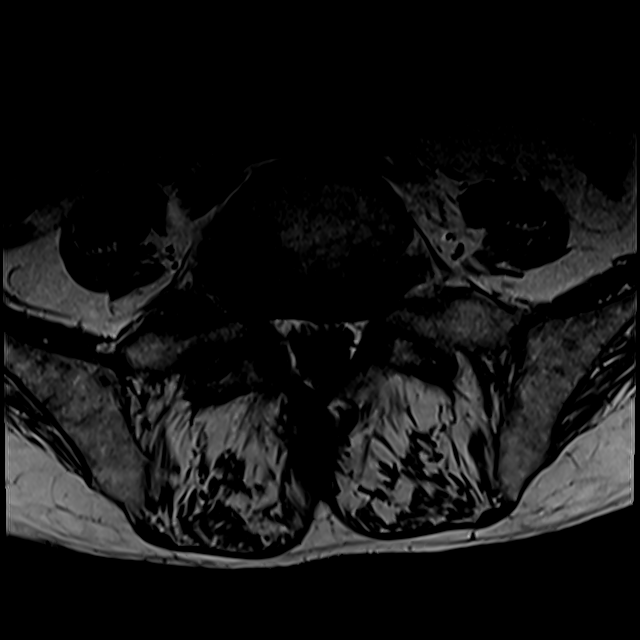
[im 25/50]
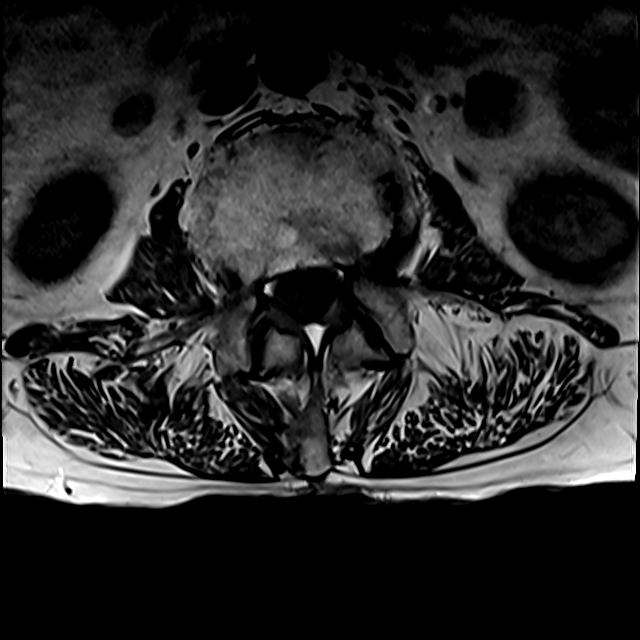
[im 43/50]
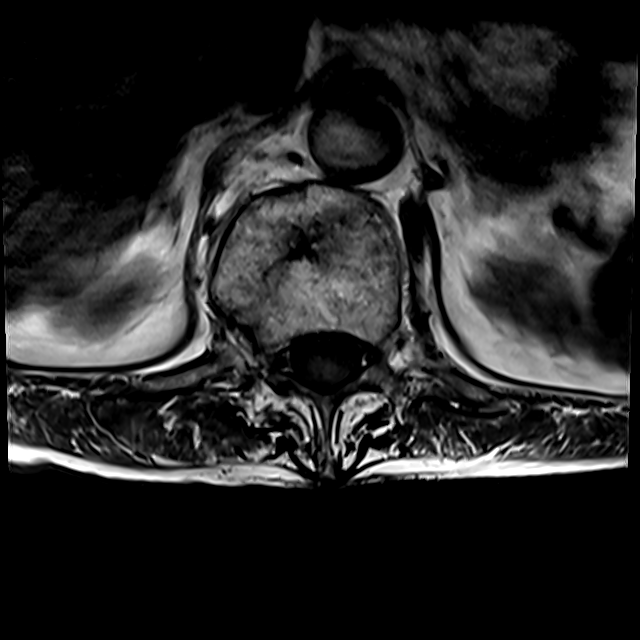

[Series 13: T2 · axial · 4.0mm · 0.28mm/px · z∈[-28,+185]mm · 6 of 50 slices shown (2 of 2)]
[im 1/50]
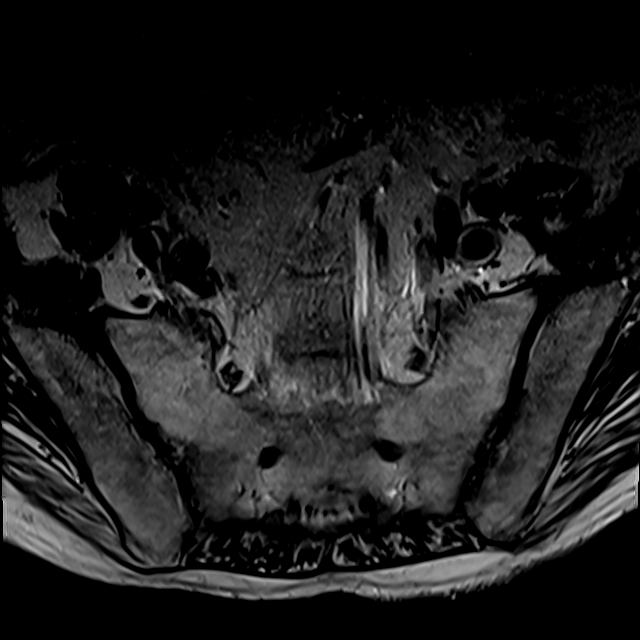
[im 8/50]
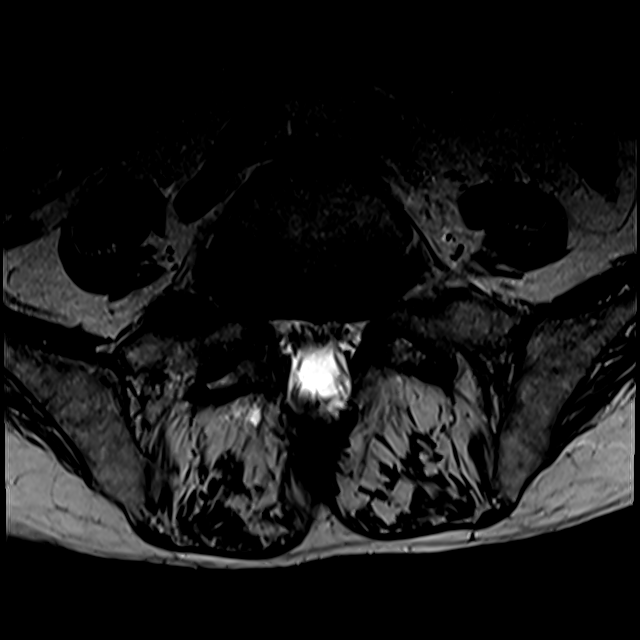
[im 15/50]
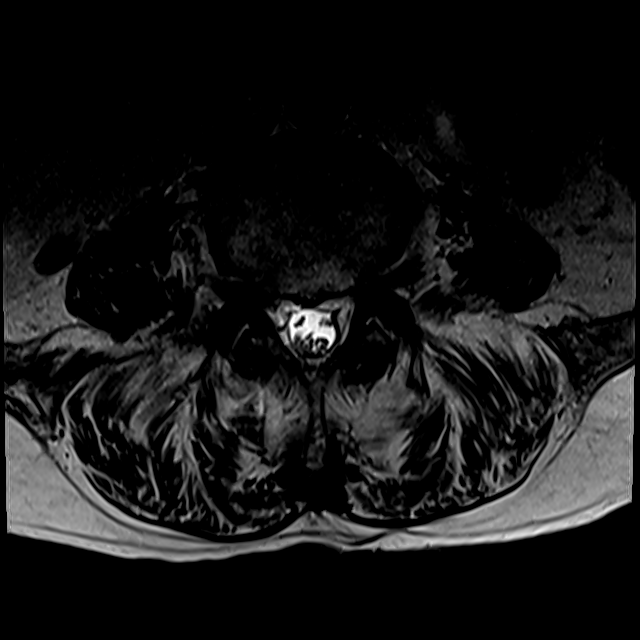
[im 22/50]
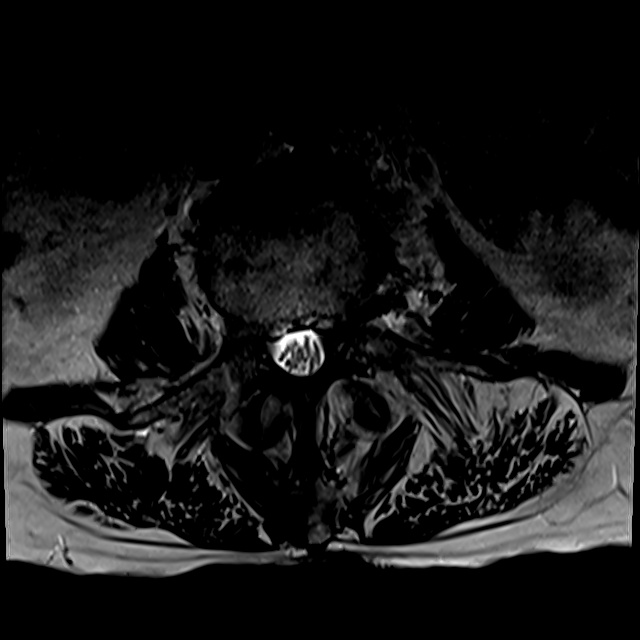
[im 25/50]
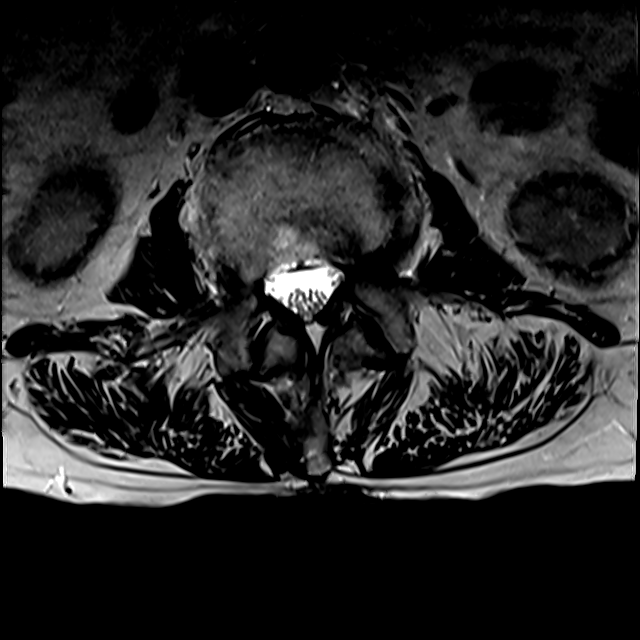
[im 43/50]
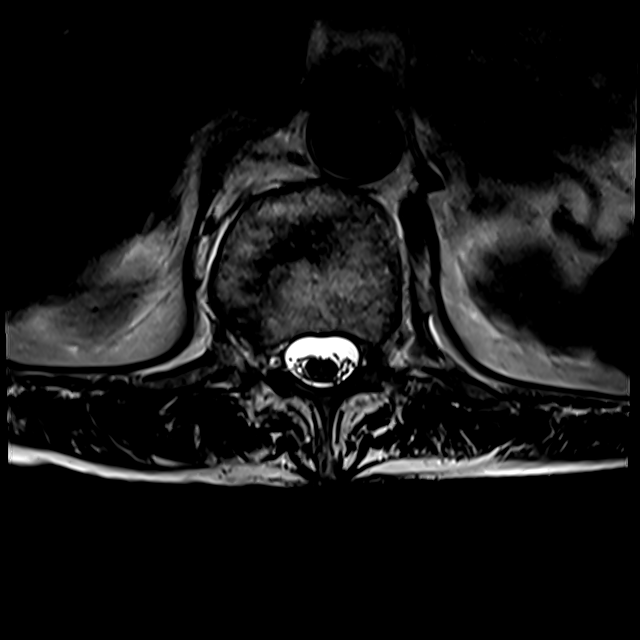

[18 of 48 positions shown; findings below may reference images not displayed]

FINDINGS: Segmentation:  5 lumbar type vertebral bodies.

Alignment:  Normal

Vertebrae: The scan covers the region from T11 through S3. There is
an acute compression fracture of T11. That level was not completely
image to. Loss of height appears to be about 25% without visible
retropulsed bone. The T11 disc bulges mildly. Old healed compression
deformities at T12, L1 and L4.

Conus medullaris: Extends to the L1 level and appears normal.

Paraspinal and other soft tissues: Negative

Disc levels:

T12-L1:  No disc bulge or herniation.  No stenosis.

L1-2: Retrolisthesis of 2 mm. Disc bulging with a focal right
posterior lateral disc herniation. Narrowing of the right lateral
recess and intervertebral foramen on the right. Similar to the
previous study.

L2-3: Retrolisthesis of 2 mm. Bulging of the disc. Narrowing of both
lateral recesses and neural foramina. Similar to the previous study.

L3-4: Endplate osteophytes and shallow protrusion of the disc.
Bilateral facet arthropathy. Moderate multifactorial stenosis of the
canal. Foraminal stenosis left worse than right. Similar to the
previous study.

L4-5: Retrolisthesis of 2 mm. Endplate osteophytes and shallow
protrusion of the disc. Mild facet hypertrophy. No central canal
stenosis. Foraminal narrowing bilaterally. Similar to the previous
study.

L5-S1: Broad-based herniation of disc material with by foraminal
extension. No central canal stenosis. Foraminal encroachment
bilaterally could compress either or both L5 nerve roots. Similar to
the previous study.
IMPRESSION: The change since the study of 01/28/2015 is that of an acute
compression fracture at T11 with loss of height of about 25%. No
visible retropulsed bone. That region was not studied in complete
detail on this lumbar exam. Old healed fractures at T12, L1 and L4.

Chronic degenerative changes throughout the lumbar region, unchanged
since Thursday January, 2015. Neural compression could occur in the right
lateral recess and foramen at L1-2, both neural foramina at L2-3,
the central canal, lateral recesses and foramina at L3-4, both
neural foramina at L4-5, and both neural foramina at L5-S1.
